# Patient Record
Sex: Male | Born: 1965 | Race: White | Hispanic: No | Marital: Single | Smoking: Never smoker
Health system: Southern US, Community
[De-identification: ages and names within clinical notes are randomized; demographics above are authoritative.]

## PROBLEM LIST (undated history)

## (undated) DIAGNOSIS — R7303 Prediabetes: Secondary | ICD-10-CM

## (undated) DIAGNOSIS — R42 Dizziness and giddiness: Secondary | ICD-10-CM

## (undated) DIAGNOSIS — R030 Elevated blood-pressure reading, without diagnosis of hypertension: Secondary | ICD-10-CM

## (undated) DIAGNOSIS — N529 Male erectile dysfunction, unspecified: Secondary | ICD-10-CM

## (undated) DIAGNOSIS — D333 Benign neoplasm of cranial nerves: Secondary | ICD-10-CM

## (undated) DIAGNOSIS — E119 Type 2 diabetes mellitus without complications: Secondary | ICD-10-CM

## (undated) HISTORY — PX: HERNIA REPAIR: SHX51

## (undated) HISTORY — PX: CLOSED MANIPULATION SHOULDER: SUR205

## (undated) HISTORY — DX: Dizziness and giddiness: R42

## (undated) HISTORY — DX: Male erectile dysfunction, unspecified: N52.9

## (undated) HISTORY — PX: COLONOSCOPY: SHX174

---

## 1988-10-18 HISTORY — PX: HERNIA REPAIR: SHX51

## 2006-02-24 ENCOUNTER — Ambulatory Visit: Payer: Self-pay | Admitting: Gastroenterology

## 2016-06-29 ENCOUNTER — Encounter (HOSPITAL_COMMUNITY): Payer: Self-pay | Admitting: *Deleted

## 2017-05-27 ENCOUNTER — Emergency Department: Payer: BLUE CROSS/BLUE SHIELD

## 2017-05-27 ENCOUNTER — Encounter: Payer: Self-pay | Admitting: Emergency Medicine

## 2017-05-27 ENCOUNTER — Emergency Department
Admission: EM | Admit: 2017-05-27 | Discharge: 2017-05-27 | Disposition: A | Discharge status: Home / Self Care | Payer: BLUE CROSS/BLUE SHIELD | Attending: Emergency Medicine | Admitting: Emergency Medicine

## 2017-05-27 DIAGNOSIS — R42 Dizziness and giddiness: Secondary | ICD-10-CM | POA: Diagnosis present

## 2017-05-27 DIAGNOSIS — H8149 Vertigo of central origin, unspecified ear: Secondary | ICD-10-CM | POA: Diagnosis not present

## 2017-05-27 DIAGNOSIS — E119 Type 2 diabetes mellitus without complications: Secondary | ICD-10-CM | POA: Diagnosis not present

## 2017-05-27 LAB — CBC
HCT: 47 % (ref 40.0–52.0)
HEMOGLOBIN: 16 g/dL (ref 13.0–18.0)
MCH: 30.5 pg (ref 26.0–34.0)
MCHC: 34.1 g/dL (ref 32.0–36.0)
MCV: 89.5 fL (ref 80.0–100.0)
Platelets: 214 10*3/uL (ref 150–440)
RBC: 5.25 MIL/uL (ref 4.40–5.90)
RDW: 13.6 % (ref 11.5–14.5)
WBC: 8.7 10*3/uL (ref 3.8–10.6)

## 2017-05-27 LAB — BASIC METABOLIC PANEL
ANION GAP: 9 (ref 5–15)
BUN: 15 mg/dL (ref 6–20)
CHLORIDE: 105 mmol/L (ref 101–111)
CO2: 26 mmol/L (ref 22–32)
Calcium: 8.7 mg/dL — ABNORMAL LOW (ref 8.9–10.3)
Creatinine, Ser: 0.65 mg/dL (ref 0.61–1.24)
GFR calc Af Amer: >60 mL/min (ref >60)
GFR calc non Af Amer: >60 mL/min (ref >60)
Glucose, Bld: 320 mg/dL — ABNORMAL HIGH (ref 65–99)
POTASSIUM: 3.5 mmol/L (ref 3.5–5.1)
SODIUM: 140 mmol/L (ref 135–145)

## 2017-05-27 LAB — TROPONIN I: Troponin I: <0.03 ng/mL (ref <0.03)

## 2017-05-27 LAB — GLUCOSE, CAPILLARY: Glucose-Capillary: 325 mg/dL — ABNORMAL HIGH (ref 65–99)

## 2017-05-27 MED ORDER — ONDANSETRON HCL 4 MG/2ML IJ SOLN
4.0000 mg | Freq: Once | INTRAMUSCULAR | Status: AC
  Administered 2017-05-27: 4 mg via INTRAVENOUS

## 2017-05-27 MED ORDER — MECLIZINE HCL 25 MG PO TABS
25.0000 mg | ORAL_TABLET | Freq: Once | ORAL | Status: AC
  Administered 2017-05-27: 25 mg via ORAL
  Filled 2017-05-27: qty 1

## 2017-05-27 MED ORDER — MECLIZINE HCL 32 MG PO TABS: 32.0000 mg | ORAL_TABLET | Freq: Three times a day (TID) | ORAL | 0 refills | Status: DC | PRN

## 2017-05-27 MED ORDER — METFORMIN HCL 500 MG PO TABS: 500.0000 mg | ORAL_TABLET | Freq: Two times a day (BID) | ORAL | 0 refills | Status: DC

## 2017-05-27 MED ORDER — ONDANSETRON HCL 4 MG/2ML IJ SOLN
INTRAMUSCULAR | Status: AC
Start: 2017-05-27 — End: 2017-05-27
  Administered 2017-05-27: 4 mg via INTRAVENOUS
  Filled 2017-05-27: qty 2

## 2017-05-27 MED ORDER — SODIUM CHLORIDE 0.9 % IV BOLUS (SEPSIS)
2000.0000 mL | Freq: Once | INTRAVENOUS | Status: AC
  Administered 2017-05-27: 2000 mL via INTRAVENOUS

## 2017-05-27 MED ORDER — MECLIZINE HCL 25 MG PO TABS
50.0000 mg | ORAL_TABLET | Freq: Once | ORAL | Status: AC
  Administered 2017-05-27: 50 mg via ORAL
  Filled 2017-05-27: qty 2

## 2017-05-27 NOTE — ED Notes (Signed)
Patient returned from MRI.

## 2017-05-27 NOTE — ED Triage Notes (Signed)
Patient to ER from home via [facility] for c/o dizziness that began at 1800 last night (05/26/17). Patient states prior to that he felt fine, dizziness hit him suddenly. Patient denies headache at any point. Denies any unilateral weakness. No facial droop noted. No slurred speech. +Diaphoresis and vomiting upon arrival. Received 4mg  Zofran via [facility].

## 2017-05-27 NOTE — ED Notes (Signed)
Patient transported to MRI 

## 2017-05-27 NOTE — ED Notes (Signed)
Patient states dizziness not improved after Meclizine.

## 2017-05-27 NOTE — ED Provider Notes (Signed)
[facility] Emergency Department Provider Note   First MD Initiated Contact with Patient 05/27/17 [phone]     (approximate)  I have reviewed the triage vital signs and the nursing notes.   HISTORY  Chief Complaint Dizziness    HPI Jonathan Herring. is a 51 y.o. male with no past medical history presents to the emergency department with acute onset of dizziness which began at 6 PM yesterday afternoon. Patient states that dizziness is persistent. Worse with any positional change. Patient admits to nausea and vomiting and is actively vomiting on arrival with EMS to the emergency department.   Past medical history None There are no active problems to display for this patient.   Past Surgical History:  Procedure Laterality Date  . HERNIA REPAIR      Prior to Admission medications   Not on File    Allergies No known drug allergies No family history on file.  Social History Social History  Substance Use Topics  . Smoking status: Never Smoker  . Smokeless tobacco: Never Used  . Alcohol use No    Review of Systems Constitutional: No fever/chills Eyes: No visual changes. ENT: No sore throat. Cardiovascular: Denies chest pain. Respiratory: Denies shortness of breath. Gastrointestinal: No abdominal pain.  No nausea, no vomiting.  No diarrhea.  No constipation. Genitourinary: Negative for dysuria. Musculoskeletal: Negative for neck pain.  Negative for back pain. Integumentary: Negative for rash. Neurological: Negative for headaches, focal weakness or numbness.Positive for dizziness   ____________________________________________   PHYSICAL EXAM:  VITAL SIGNS: ED Triage Vitals  Enc Vitals Group     BP 05/27/17 0423 (!) 143/86     Pulse Rate 05/27/17 0423 73     Resp 05/27/17 0423 20     Temp 05/27/17 0423 98.2 F (36.8 C)     Temp Source 05/27/17 0423 Oral     SpO2 05/27/17 0423 98 %     Weight 05/27/17 0424 113 kg (249 lb 1.9 oz)   Height 05/27/17 0424 1.778 m (5\' 10" )     Head Circumference --      Peak Flow --      Pain Score --      Pain Loc --      Pain Edu? --      Excl. in [place]? --     Constitutional: Alert and oriented. Well appearing and in no acute distress. Eyes: Conjunctivae are normal. PERRL. EOMI. positive for horizontal nystagmus Head: Atraumatic. Ears:  Healthy appearing ear canals and TMs bilaterally Nose: No congestion/rhinnorhea. Mouth/Throat: Mucous membranes are moist.  Oropharynx non-erythematous. Neck: No stridor.   Cardiovascular: Normal rate, regular rhythm. Good peripheral circulation. Grossly normal heart sounds. Respiratory: Normal respiratory effort.  No retractions. Lungs CTAB. Gastrointestinal: Soft and nontender. No distention.  Musculoskeletal: No lower extremity tenderness nor edema. No gross deformities of extremities. Neurologic:  Normal speech and language. No gross focal neurologic deficits are appreciated.  Skin:  Skin is warm, dry and intact. No rash noted. Psychiatric: Mood and affect are normal. Speech and behavior are normal.  ____________________________________________   LABS (all labs ordered are listed, but only abnormal results are displayed)  Labs Reviewed  BASIC METABOLIC PANEL - Abnormal; Notable for the following:       Result Value   Glucose, Bld 320 (*)    Calcium 8.7 (*)    All other components within normal limits  GLUCOSE, CAPILLARY - Abnormal; Notable for the following:    Glucose-Capillary 325 (*)  All other components within normal limits  CBC  TROPONIN I  URINALYSIS, COMPLETE (UACMP) WITH MICROSCOPIC  CBG MONITORING, ED   ____________________________________________  EKG  ED ECG REPORT I, [place] N BROWN, the attending physician, personally viewed and interpreted this ECG.   Date: 05/27/2017  EKG Time: 4:24 AM  Rate: 70  Rhythm: Normal sinus rhythm  Axis: Normal  Intervals: Normal  ST&T Change:  None  ____________________________________________  RADIOLOGY I, [place] N BROWN, personally viewed and evaluated these images (plain radiographs) as part of my medical decision making, as well as reviewing the written report by the radiologist.  Ct Head Wo Contrast  Result Date: 05/27/2017 CLINICAL DATA:  Dizziness beginning at 1800 hours last night. Diaphoresis and vomiting. EXAM: CT HEAD WITHOUT CONTRAST TECHNIQUE: Contiguous axial images were obtained from the base of the skull through the vertex without intravenous contrast. COMPARISON:  None. FINDINGS: BRAIN: No intraparenchymal hemorrhage, mass effect nor midline shift. The ventricles and sulci are normal. No acute large vascular territory infarcts. Hypodense inferior pons corresponding to beam hardening artifact best seen on sagittal 27/50. No abnormal extra-axial fluid collections. Basal cisterns are patent. VASCULAR: Trace calcific atherosclerosis carotid siphon included RIGHT vertebral artery. SKULL/SOFT TISSUES: No skull fracture. No significant soft tissue swelling. ORBITS/SINUSES: The included ocular globes and orbital contents are normal.The mastoid aircells and included paranasal sinuses are well-aerated. OTHER: None. IMPRESSION: 1. Beam hardening artifacts through pons. Given patient's symptoms, underlying infarct is possible. 2. Otherwise negative noncontrast head. Electronically Signed   By: Elon Alas M.D.   On: 05/27/2017 05:33      Procedures   ____________________________________________   INITIAL IMPRESSION / ASSESSMENT AND PLAN / ED COURSE  Pertinent labs & imaging results that were available during my care of the patient were reviewed by me and considered in my medical decision making (see chart for details).  51 year old male presenting to the urgency Department via EMS with vertiginous symptoms. Concern for possible central versus peripheral vertigo as such CT scan of the head was performed which revealed  findings in the pons concerning for possible infarct. As such MRI of the brain was performed which revealed no acute infarct.. In addition patient noted to be hyperglycemic on arrival with a glucose of 320. Patient received 2 L IV normal saline will recheck glucose. Patient advised of the diagnosis of diabetes given glucose exceeding 200. I spoke with the patient at length regarding all clinical findings. I was informed that the patient's mother wanted to be informed as well and a such I return to the room and the patient gave permission for me to inform his mother which I did      ____________________________________________  FINAL CLINICAL IMPRESSION(S) / ED DIAGNOSES  Final diagnoses:  New onset type 2 diabetes mellitus ([place])  Vertigo     MEDICATIONS GIVEN DURING THIS VISIT:  Medications  sodium chloride 0.9 % bolus 2,000 mL (2,000 mLs Intravenous New Bag/Given 05/27/17 0430)  ondansetron (ZOFRAN) injection 4 mg (4 mg Intravenous Given 05/27/17 0430)  meclizine (ANTIVERT) tablet 50 mg (50 mg Oral Given 05/27/17 0559)     NEW OUTPATIENT MEDICATIONS STARTED DURING THIS VISIT:  New Prescriptions   No medications on file    Modified Medications   No medications on file    Discontinued Medications   No medications on file     Note:  This document was prepared using Dragon voice recognition software and may include unintentional dictation errors.    Gregor Hams, MD 05/28/17  0740  

## 2017-05-31 ENCOUNTER — Other Ambulatory Visit: Payer: Self-pay | Admitting: Otolaryngology

## 2017-05-31 DIAGNOSIS — R42 Dizziness and giddiness: Secondary | ICD-10-CM

## 2017-05-31 DIAGNOSIS — R972 Elevated prostate specific antigen [PSA]: Secondary | ICD-10-CM | POA: Insufficient documentation

## 2017-05-31 DIAGNOSIS — E118 Type 2 diabetes mellitus with unspecified complications: Secondary | ICD-10-CM | POA: Insufficient documentation

## 2017-06-08 ENCOUNTER — Ambulatory Visit
Admission: RE | Admit: 2017-06-08 | Discharge: 2017-06-08 | Disposition: A | Discharge status: Home / Self Care | Payer: BLUE CROSS/BLUE SHIELD | Source: Ambulatory Visit | Attending: Otolaryngology | Admitting: Otolaryngology

## 2017-06-08 DIAGNOSIS — D333 Benign neoplasm of cranial nerves: Secondary | ICD-10-CM | POA: Diagnosis not present

## 2017-06-08 DIAGNOSIS — R42 Dizziness and giddiness: Secondary | ICD-10-CM

## 2017-06-08 MED ORDER — GADOBENATE DIMEGLUMINE 529 MG/ML IV SOLN
20.0000 mL | Freq: Once | INTRAVENOUS | Status: AC | PRN
  Administered 2017-06-08: 20 mL via INTRAVENOUS

## 2017-06-27 ENCOUNTER — Encounter: Payer: Self-pay | Admitting: Dietician

## 2017-06-27 ENCOUNTER — Ambulatory Visit: Payer: BLUE CROSS/BLUE SHIELD

## 2017-06-27 ENCOUNTER — Encounter: Payer: BLUE CROSS/BLUE SHIELD | Attending: Internal Medicine | Admitting: Dietician

## 2017-06-27 VITALS — BP 114/80 | Ht 71.0 in | Wt 238.2 lb

## 2017-06-27 DIAGNOSIS — Z713 Dietary counseling and surveillance: Secondary | ICD-10-CM | POA: Diagnosis present

## 2017-06-27 DIAGNOSIS — E119 Type 2 diabetes mellitus without complications: Secondary | ICD-10-CM

## 2017-06-27 DIAGNOSIS — E118 Type 2 diabetes mellitus with unspecified complications: Secondary | ICD-10-CM | POA: Insufficient documentation

## 2017-06-27 NOTE — Progress Notes (Signed)
Diabetes Self-Management Education  Visit Type: First/Initial  Appt. Start Time: 1400 Appt. End Time: 4174  06/27/2017  [name], identified by name and date of birth, is a 51 y.o. male with a diagnosis of Diabetes: Type 2.   ASSESSMENT  Blood pressure 114/80, height 5\' 11"  (1.803 m), weight 238 lb 3.2 oz (108 kg). Body mass index is 33.22 kg/m.      Diabetes Self-Management Education - 06/27/17 1534      Visit Information   Visit Type First/Initial     Initial Visit   Diabetes Type Type 2     Health Coping   How would you rate your overall health? Excellent     Psychosocial Assessment   Patient Belief/Attitude about Diabetes Motivated to manage diabetes   Self-care barriers None   Self-management support Family;Doctor's office   Other persons present Patient   Patient Concerns Glycemic Control;Weight Control;Medication;Healthy Lifestyle  become more fit; prevent complications   Special Needs None   Preferred Learning Style Auditory;Visual;Hands on   Learning Readiness Ready  pt already making diet improvements and started regular exercise   What is the last grade level you completed in school? 2 year degree at [employer]     Pre-Education Assessment   Patient understands the diabetes disease and treatment process. Needs Instruction   Patient understands incorporating nutritional management into lifestyle. Needs Instruction   Patient undertands incorporating physical activity into lifestyle. Needs Instruction   Patient understands using medications safely. Needs Instruction   Patient understands monitoring blood glucose, interpreting and using results Needs Review   Patient understands prevention, detection, and treatment of acute complications. Needs Instruction   Patient understands prevention, detection, and treatment of chronic complications. Needs Instruction   Patient understands how to develop strategies to address psychosocial issues. Needs Instruction   Patient understands how to develop strategies to promote health/change behavior. Needs Instruction     Complications   Last HgB A1C per patient/outside source 12 %  05-31-17   How often do you check your blood sugar? 1-2 times/day   Fasting Blood glucose range (mg/dL) 70-129   Have you had a dilated eye exam in the past 12 months? Yes  2 wk ago   Have you had a dental exam in the past 12 months? Yes  2 wk ago   Are you checking your feet? No     Dietary Intake   Breakfast --  eats breakfast at 7a (usually cheerios and milk + 1 stick cheese)   Snack (morning) none   Lunch --  eats lunch at 12p; has decreased intake of fried foods, sweets/desserts and sweetened beverages   Snack (afternoon) --  eats snack at 3p-4p-fruit, popcorn, crackers or nuts   Dinner --  eats supper at 7p-8p; c/o feeling hungry most of time   Snack (evening) --  none   Beverage(s) --  drinks water 8+x/day and milk 2x/day     Exercise   Exercise Type Light (walking / raking leaves)  walks 15 min 4x/day   How many days per week to you exercise? 7   How many minutes per day do you exercise? 60   Total minutes per week of exercise 420     Patient Education   Previous Diabetes Education No   Disease state  Definition of diabetes, type 1 and 2, and the diagnosis of diabetes;Explored patient's options for treatment of their diabetes;Factors that contribute to the development of diabetes   Nutrition management  Role of  diet in the treatment of diabetes and the relationship between the three main macronutrients and blood glucose level;Food label reading, portion sizes and measuring food.;Carbohydrate counting   Physical activity and exercise  Role of exercise on diabetes management, blood pressure control and cardiac health.;Helped patient identify appropriate exercises in relation to his/her diabetes, diabetes complications and other health issue.   Medications Reviewed patients medication for diabetes, action,  purpose, timing of dose and side effects.   Monitoring Purpose and frequency of SMBG.;Taught/discussed recording of test results and interpretation of SMBG.;Yearly dilated eye exam;Identified appropriate SMBG and/or A1C goals.   Chronic complications Relationship between chronic complications and blood glucose control;Lipid levels, blood glucose control and heart disease;Dental care;Retinopathy and reason for yearly dilated eye exams;Reviewed with patient heart disease, higher risk of, and prevention;Nephropathy, what it is, prevention of, the use of ACE, ARB's and early detection of through urine microalbumia.   Personal strategies to promote health Lifestyle issues that need to be addressed for better diabetes care;Helped patient develop diabetes management plan for (enter comment)     Outcomes   Expected Outcomes Demonstrated interest in learning. Expect positive outcomes      Individualized Plan for Diabetes Self-Management Training:   Learning Objective:  Patient will have a greater understanding of diabetes self-management. Patient education plan is to attend individual and/or group sessions per assessed needs and concerns.   Plan:   Patient Instructions   Check blood sugars 2 x day before breakfast and 2 hrs after supper every day and record Bring blood sugar records to the next appointment/class Exercise:  Continue walking 30-45 min/day if able  Eat 3 meals day,   2  small snacks a day in afternoon and at bedtime Space meals 4-6 hours apart Avoid sugar sweetened drinks (soda, tea, coffee, sports drinks, juices) Limit intake of sweets and fried foods Eat 3-4 carbohydrate servings/meal + protein Eat 1 carbohydrate serving/snack + protein Get a Sharps container Return for appointment/classes on:  07-21-17   Expected Outcomes:  Demonstrated interest in learning. Expect positive outcomes  Education material provided: General meal planning guidelines  If problems or questions,  patient to contact team via: [phone]  Future DSME appointment:  07-21-17

## 2017-06-27 NOTE — Patient Instructions (Addendum)
  Check blood sugars 2 x day before breakfast and 2 hrs after supper every day and record Bring blood sugar records to the next appointment/class Exercise:  Continue walking 30-45 min/day if able  Eat 3 meals day,   2  small snacks a day in afternoon and at bedtime Space meals 4-6 hours apart Avoid sugar sweetened drinks (soda, tea, coffee, sports drinks, juices) Limit intake of sweets and fried foods Eat 3-4 carbohydrate servings/meal + protein Eat 1 carbohydrate serving/snack + protein Get a Sharps container Return for appointment/classes on:  07-21-17

## 2017-07-11 DIAGNOSIS — D333 Benign neoplasm of cranial nerves: Secondary | ICD-10-CM | POA: Insufficient documentation

## 2017-07-21 ENCOUNTER — Encounter: Payer: BLUE CROSS/BLUE SHIELD | Attending: Internal Medicine | Admitting: Dietician

## 2017-07-21 VITALS — Ht 71.0 in | Wt 235.1 lb

## 2017-07-21 DIAGNOSIS — E119 Type 2 diabetes mellitus without complications: Secondary | ICD-10-CM

## 2017-07-21 DIAGNOSIS — Z713 Dietary counseling and surveillance: Secondary | ICD-10-CM | POA: Diagnosis present

## 2017-07-21 NOTE — Progress Notes (Signed)

## 2017-07-28 ENCOUNTER — Encounter: Payer: BLUE CROSS/BLUE SHIELD | Admitting: *Deleted

## 2017-07-28 ENCOUNTER — Encounter: Payer: Self-pay | Admitting: *Deleted

## 2017-07-28 VITALS — Wt 234.1 lb

## 2017-07-28 DIAGNOSIS — Z713 Dietary counseling and surveillance: Secondary | ICD-10-CM | POA: Diagnosis not present

## 2017-07-28 DIAGNOSIS — E119 Type 2 diabetes mellitus without complications: Secondary | ICD-10-CM

## 2017-07-28 NOTE — Progress Notes (Signed)

## 2017-08-04 ENCOUNTER — Encounter: Payer: BLUE CROSS/BLUE SHIELD | Admitting: Dietician

## 2017-08-04 VITALS — BP 118/80 | Ht 71.0 in | Wt 236.3 lb

## 2017-08-04 DIAGNOSIS — E669 Obesity, unspecified: Secondary | ICD-10-CM | POA: Insufficient documentation

## 2017-08-04 DIAGNOSIS — E119 Type 2 diabetes mellitus without complications: Secondary | ICD-10-CM

## 2017-08-04 DIAGNOSIS — Z713 Dietary counseling and surveillance: Secondary | ICD-10-CM | POA: Diagnosis not present

## 2017-08-04 NOTE — Progress Notes (Signed)

## 2017-08-08 ENCOUNTER — Encounter: Payer: Self-pay | Admitting: Dietician

## 2018-03-05 DIAGNOSIS — R030 Elevated blood-pressure reading, without diagnosis of hypertension: Secondary | ICD-10-CM | POA: Insufficient documentation

## 2018-07-04 DIAGNOSIS — H9041 Sensorineural hearing loss, unilateral, right ear, with unrestricted hearing on the contralateral side: Secondary | ICD-10-CM | POA: Insufficient documentation

## 2018-07-04 DIAGNOSIS — IMO0001 Reserved for inherently not codable concepts without codable children: Secondary | ICD-10-CM | POA: Insufficient documentation

## 2018-07-08 IMAGING — MR MR MRA HEAD W/O CM
11 series · 42 of 48 positions shown · non-contrast
Comparison: Head CT from yesterday

CLINICAL DATA: Dizziness

EXAM:
MRI HEAD WITHOUT CONTRAST
MRA HEAD WITHOUT CONTRAST
TECHNIQUE: Multiplanar, multiecho pulse sequences of the brain and surrounding
structures were obtained without intravenous contrast. Angiographic
images of the head were obtained using MRA technique without
contrast.

[Series 2: TOF · axial · non-contrast · 0.7mm · 0.43mm/px · z∈[-46,+49]mm · 8 of 140 slices shown]
[im 1/140]
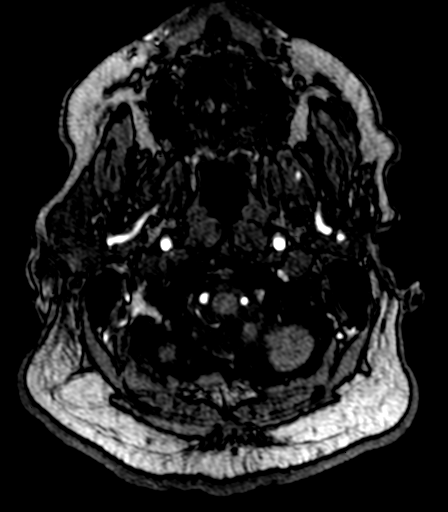
[im 22/140]
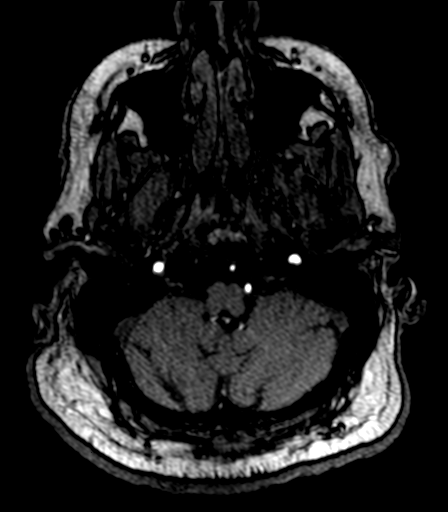
[im 43/140]
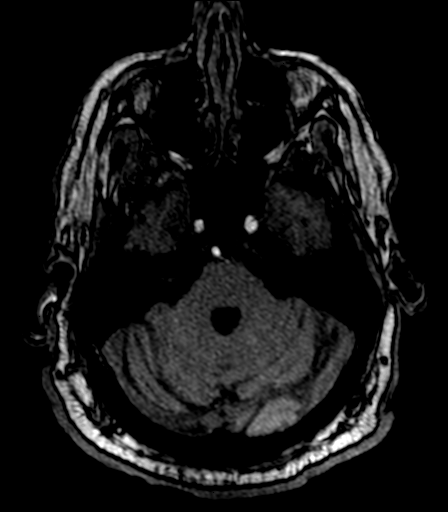
[im 65/140]
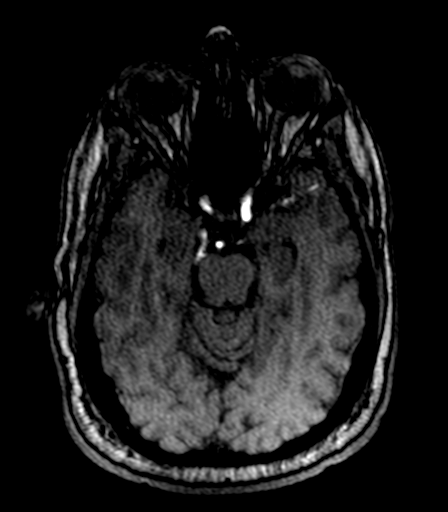
[im 75/140]
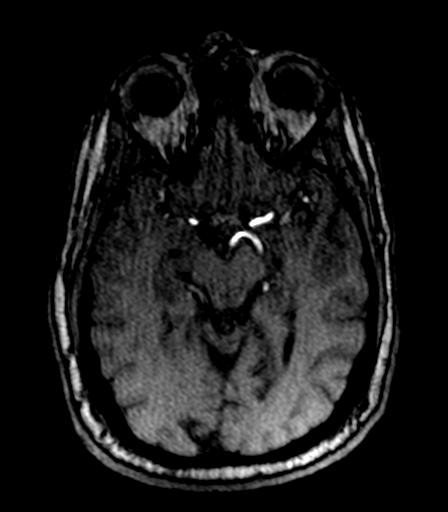
[im 97/140]
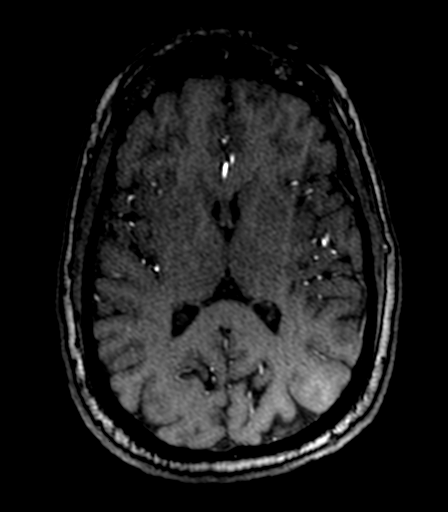
[im 118/140]
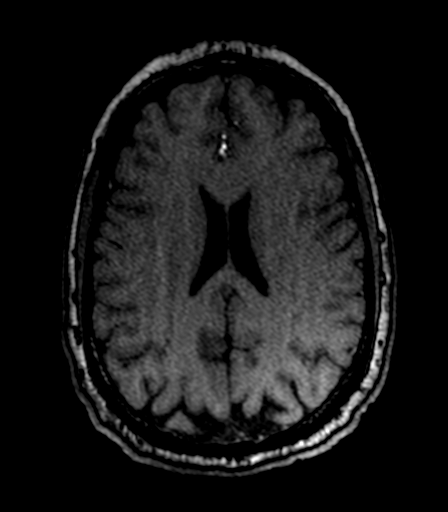
[im 140/140]
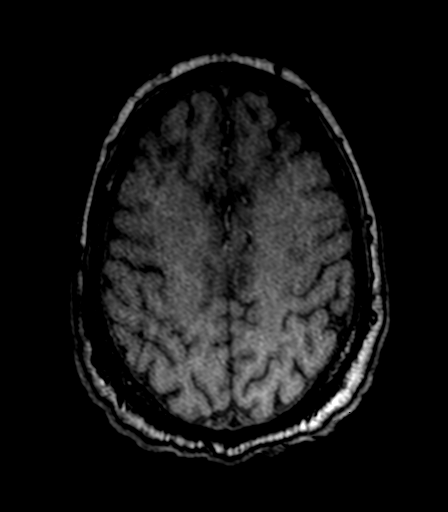

[Series 7: T1 · sagittal · 5.0mm · 0.45mm/px · 2 of 23 slices shown (1 of 2)]
[im 1/23]
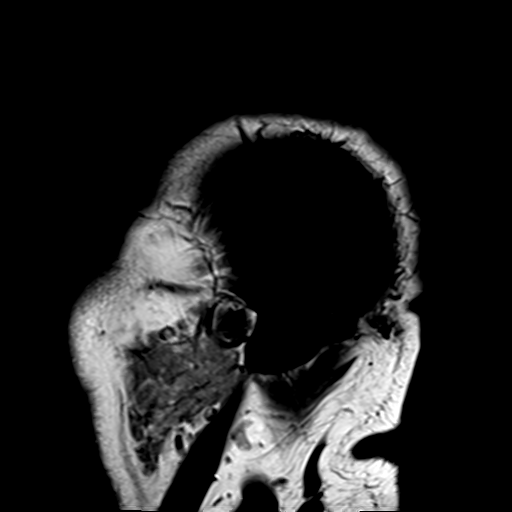
[im 23/23]
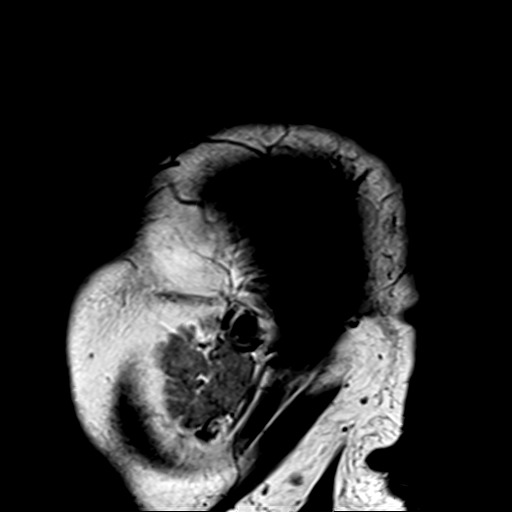

[Series 10: DWI · axial · 3.0mm · 1.80mm/px · z∈[-60,+82]mm · 4 of 49 slices shown (1 of 2)]
[im 1/49]
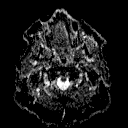
[im 17/49]
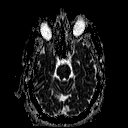
[im 33/49]
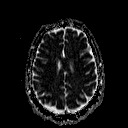
[im 49/49]
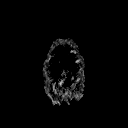

[Series 12: DWI · coronal · 3.0mm · 1.80mm/px · 4 of 44 slices shown (2 of 2)]
[im 1/44]
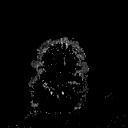
[im 15/44]
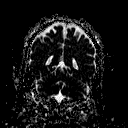
[im 29/44]
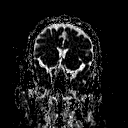
[im 44/44]
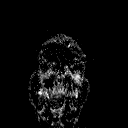

[Series 13: T2 · axial · 5.0mm · 0.60mm/px · z∈[-52,+87]mm · 2 of 23 slices shown (1 of 3)]
[im 1/23]
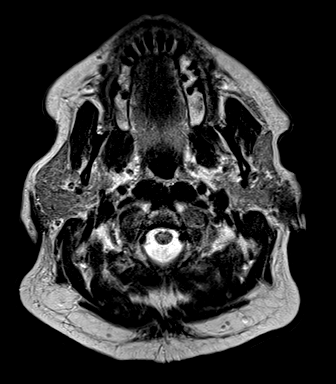
[im 23/23]
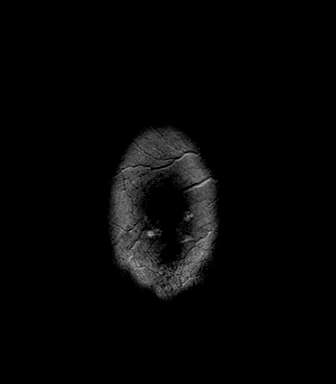

[Series 14: FLAIR · axial · 3.0mm · 0.45mm/px · z∈[-54,+89]mm · 5 of 50 slices shown]
[im 1/50]
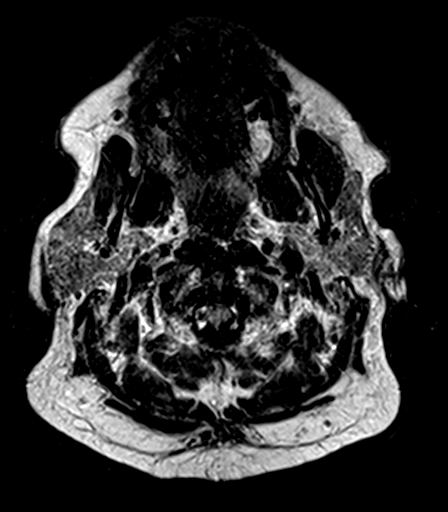
[im 13/50]
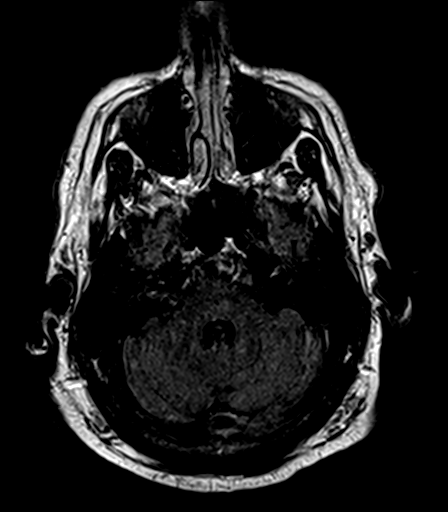
[im 25/50]
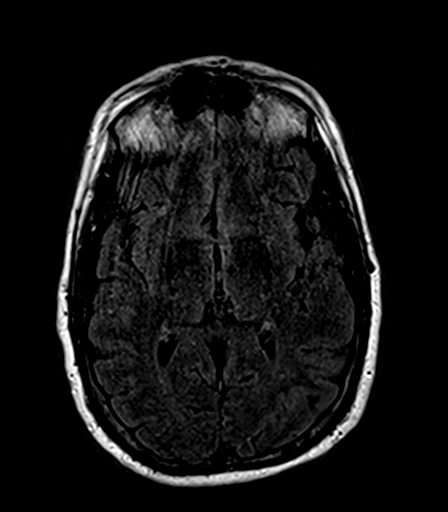
[im 37/50]
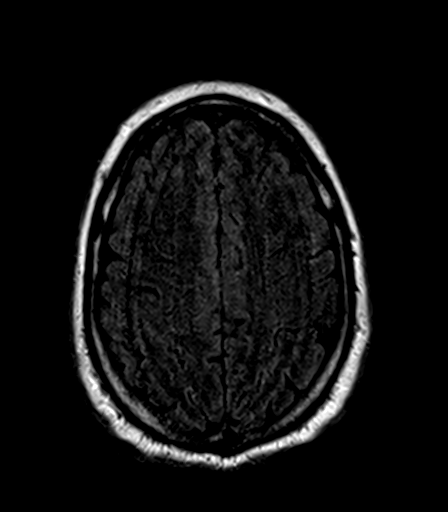
[im 50/50]
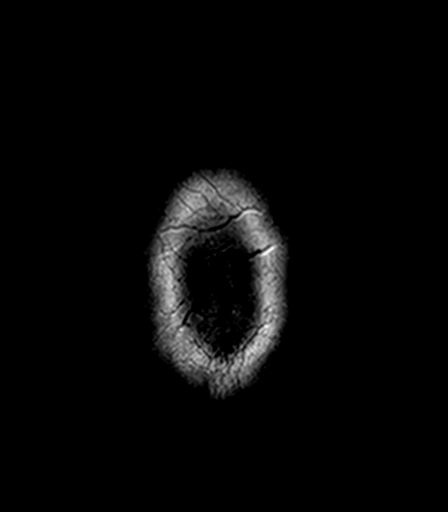

[Series 15: T2 · axial · 5.0mm · 0.45mm/px · z∈[-56,+90]mm · 2 of 24 slices shown (2 of 3)]
[im 1/24]
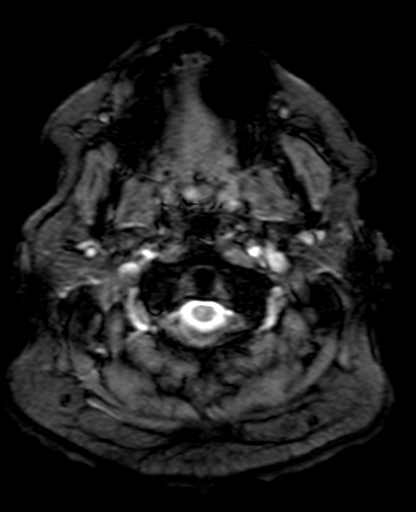
[im 24/24]
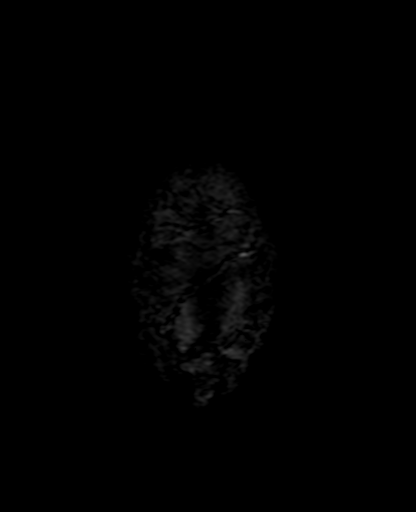

[Series 16: T1 · axial · 3.0mm · 1.00mm/px · z∈[-55,+95]mm · 5 of 52 slices shown (2 of 2)]
[im 1/52]
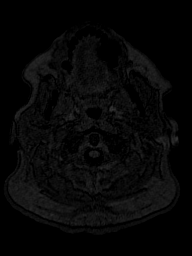
[im 13/52]
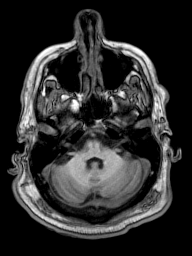
[im 26/52]
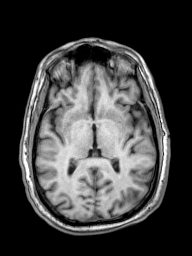
[im 39/52]
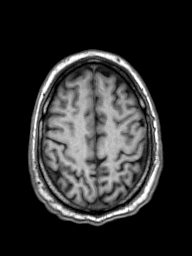
[im 52/52]
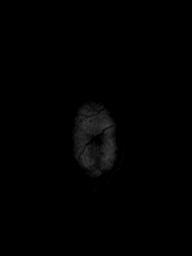

[Series 17: T2 · coronal · 5.0mm · 0.86mm/px · 2 of 27 slices shown (3 of 3)]
[im 1/27]
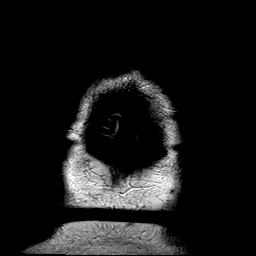
[im 27/27]
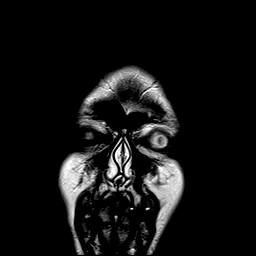

[Series 106: ax (id) · axial · 3.0mm · 1.80mm/px · z∈[-57,+82]mm · 4 of 47 slices shown]
[im 1/47]
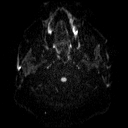
[im 16/47]
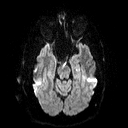
[im 31/47]
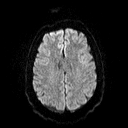
[im 47/47]
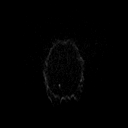

[Series 107: cor (id) · coronal · 3.0mm · 1.80mm/px · 4 of 45 slices shown]
[im 1/45]
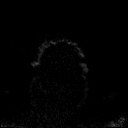
[im 15/45]
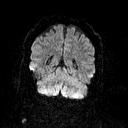
[im 30/45]
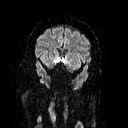
[im 45/45]
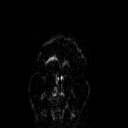

[42 of 48 positions shown; findings below may reference images not displayed]

FINDINGS: MRI HEAD FINDINGS

Brain: No acute infarction, hemorrhage, hydrocephalus, extra-axial
collection or mass effect. No atrophy or white matter disease. On
axial T2 weighted imaging there is an asymmetric nodular appearing
isointense appearance in the superficial right internal auditory
canal. This appearance is usually from volume averaging but there is
also asymmetric signal in this area on axial T1 weighted imaging.

Vascular: Arterial findings below. Normal dural venous sinus flow
voids.

Skull and upper cervical spine: Negative for marrow lesion

Sinuses/Orbits: Negative

MRA HEAD FINDINGS

Aplastic right A1 segment and hypoplastic right P1 segment. There is
a early branch from the left MCA. When accounting for artifact at
the level of the skullbase, sphenoid sinuses, and left M2 segments
there is no stenosis, beading, or aneurysm. No major branch
occlusion.
IMPRESSION: Brain MRI:

1. No acute finding.
2. Asymmetric signal in the right internal auditory canal that is
likely from volume averaging. If persistent or unexplained dizziness
could to outpatient workup for schwannoma.

Intracranial MRA:

Negative.

## 2018-10-26 DIAGNOSIS — N529 Male erectile dysfunction, unspecified: Secondary | ICD-10-CM | POA: Insufficient documentation

## 2019-01-29 ENCOUNTER — Encounter: Payer: Self-pay | Admitting: Emergency Medicine

## 2019-01-29 ENCOUNTER — Emergency Department
Admission: EM | Admit: 2019-01-29 | Discharge: 2019-01-29 | Disposition: A | Discharge status: Home / Self Care | Payer: BLUE CROSS/BLUE SHIELD | Attending: Emergency Medicine | Admitting: Emergency Medicine

## 2019-01-29 ENCOUNTER — Other Ambulatory Visit: Payer: Self-pay

## 2019-01-29 DIAGNOSIS — Z7984 Long term (current) use of oral hypoglycemic drugs: Secondary | ICD-10-CM | POA: Diagnosis not present

## 2019-01-29 DIAGNOSIS — R42 Dizziness and giddiness: Secondary | ICD-10-CM | POA: Diagnosis not present

## 2019-01-29 DIAGNOSIS — D333 Benign neoplasm of cranial nerves: Secondary | ICD-10-CM | POA: Diagnosis not present

## 2019-01-29 DIAGNOSIS — E119 Type 2 diabetes mellitus without complications: Secondary | ICD-10-CM | POA: Insufficient documentation

## 2019-01-29 HISTORY — DX: Type 2 diabetes mellitus without complications: E11.9

## 2019-01-29 LAB — BASIC METABOLIC PANEL
Anion gap: 10 (ref 5–15)
BUN: 26 mg/dL — ABNORMAL HIGH (ref 6–20)
CO2: 27 mmol/L (ref 22–32)
Calcium: 9.2 mg/dL (ref 8.9–10.3)
Chloride: 105 mmol/L (ref 98–111)
Creatinine, Ser: 0.53 mg/dL — ABNORMAL LOW (ref 0.61–1.24)
GFR calc Af Amer: >60 mL/min (ref >60)
GFR calc non Af Amer: >60 mL/min (ref >60)
Glucose, Bld: 116 mg/dL — ABNORMAL HIGH (ref 70–99)
Potassium: 3.6 mmol/L (ref 3.5–5.1)
Sodium: 142 mmol/L (ref 135–145)

## 2019-01-29 LAB — CBC
HCT: 47 % (ref 39.0–52.0)
Hemoglobin: 16.1 g/dL (ref 13.0–17.0)
MCH: 30.6 pg (ref 26.0–34.0)
MCHC: 34.3 g/dL (ref 30.0–36.0)
MCV: 89.2 fL (ref 80.0–100.0)
Platelets: 240 10*3/uL (ref 150–400)
RBC: 5.27 MIL/uL (ref 4.22–5.81)
RDW: 13 % (ref 11.5–15.5)
WBC: 7.1 10*3/uL (ref 4.0–10.5)
nRBC: 0 % (ref 0.0–0.2)

## 2019-01-29 MED ORDER — SODIUM CHLORIDE 0.9% FLUSH: 3.0000 mL | Freq: Once | INTRAVENOUS | Status: DC

## 2019-01-29 MED ORDER — DIAZEPAM 2 MG PO TABS: 2.0000 mg | ORAL_TABLET | Freq: Three times a day (TID) | ORAL | 0 refills | Status: AC | PRN

## 2019-01-29 MED ORDER — MECLIZINE HCL 25 MG PO TABS: 25.0000 mg | ORAL_TABLET | Freq: Three times a day (TID) | ORAL | 0 refills | Status: DC | PRN

## 2019-01-29 MED ORDER — ONDANSETRON HCL 4 MG PO TABS: 4.0000 mg | ORAL_TABLET | Freq: Three times a day (TID) | ORAL | 0 refills | Status: DC | PRN

## 2019-01-29 MED ORDER — LORAZEPAM 2 MG/ML IJ SOLN
2.0000 mg | Freq: Once | INTRAMUSCULAR | Status: AC
  Administered 2019-01-29: 2 mg via INTRAVENOUS
  Filled 2019-01-29: qty 1

## 2019-01-29 MED ORDER — SODIUM CHLORIDE 0.9 % IV BOLUS
1000.0000 mL | Freq: Once | INTRAVENOUS | Status: AC
  Administered 2019-01-29: 16:00:00 1000 mL via INTRAVENOUS

## 2019-01-29 MED ORDER — ONDANSETRON HCL 4 MG/2ML IJ SOLN
4.0000 mg | Freq: Once | INTRAMUSCULAR | Status: AC
  Administered 2019-01-29: 16:00:00 4 mg via INTRAVENOUS
  Filled 2019-01-29: qty 2

## 2019-01-29 MED ORDER — HYDROMORPHONE HCL 1 MG/ML IJ SOLN: 1.0000 mg | Freq: Once | INTRAMUSCULAR | Status: DC

## 2019-01-29 NOTE — ED Provider Notes (Addendum)
[facility] Emergency Department Provider Note  ____________________________________________   I have reviewed the triage vital signs and the nursing notes. Where available I have reviewed prior notes and, if possible and indicated, outside hospital notes.    HISTORY  Chief Complaint Dizziness    HPI Jonathan Herring. is a 53 y.o. 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  No head trauma no focal numbness or weakness did not have any headache.  No other complaint no change in hearing or speech.  States that he does have a buzzing sound sometimes from his schwannoma.  Also has been vomiting with this vertigo.  His lungs he sits still he is doing okay, when he moves it is worse.  No other alleviating or aggravating factors no other complaints tried meclizine which has had some limited help.   Past Medical History:  Diagnosis Date  . Diabetes mellitus without complication ([place])   . Vertigo     Patient Active Problem List   Diagnosis Date Noted  . Obesity, unspecified 08/04/2017  . Right acoustic neuroma ([place]) 07/11/2017  . Elevated PSA measurement 05/31/2017  . Intermittent vertigo 05/31/2017  . Type 2 diabetes mellitus with complication, without long-term current use of insulin ([place]) 05/31/2017    Past Surgical History:  Procedure Laterality Date  . HERNIA REPAIR      Prior to Admission medications   Medication Sig Start Date End Date Taking? Authorizing Provider  meclizine (ANTIVERT) 25 MG tablet Take 25 mg by mouth 3 (three) times daily as needed.  05/27/17   [provider]  metFORMIN (GLUCOPHAGE) 500 MG tablet Take 1 tablet (500 mg total) by mouth 2 (two) times daily with a meal. 05/27/17   Gregor Hams, MD    Allergies Patient has no known  allergies.  No family history on file.  Social History Social History   Tobacco Use  . Smoking status: Never Smoker  . Smokeless tobacco: Never Used  Substance Use Topics  . Alcohol use: No  . Drug use: No    Review of Systems Constitutional: No fever/chills Eyes: No visual changes. ENT: No sore throat. No stiff neck no neck pain Cardiovascular: Denies chest pain. Respiratory: Denies shortness of breath. Gastrointestinal:   + vomiting.  No diarrhea.  No constipation. Genitourinary: Negative for dysuria. Musculoskeletal: Negative lower extremity swelling Skin: Negative for rash. Neurological: Negative for severe headaches, focal weakness or numbness.   ____________________________________________   PHYSICAL EXAM:  VITAL SIGNS: ED Triage Vitals  Enc Vitals Group     BP 01/29/19 1510 132/71     Pulse Rate 01/29/19 1510 77     Resp 01/29/19 1510 14     Temp 01/29/19 1510 98 F (36.7 C)     Temp Source 01/29/19 1510 Oral     SpO2 01/29/19 1510 100 %     Weight 01/29/19 1511 250 lb (113.4 kg)     Height 01/29/19 1511 5\' 11"  (1.803 m)     Head Circumference --      Peak Flow --      Pain Score 01/29/19 1511 0     Pain Loc --      Pain Edu? --      Excl. in [place]? --     Constitutional: Alert and oriented. Well appearing and  in no acute distress. Eyes: Conjunctivae are normal Head: Atraumatic HEENT: No congestion/rhinnorhea. Mucous membranes are moist.  Oropharynx non-erythematous Neck:   Nontender with no meningismus, no masses, no stridor Cardiovascular: Normal rate, regular rhythm. Grossly normal heart sounds.  Good peripheral circulation. Respiratory: Normal respiratory effort.  No retractions. Lungs CTAB. Abdominal: Soft and nontender. No distention. No guarding no rebound Back:  There is no focal tenderness or step off.  there is no midline tenderness there are no lesions noted. there is no CVA tenderness Musculoskeletal: No lower extremity tenderness, no  upper extremity tenderness. No joint effusions, no DVT signs strong distal pulses no edema Neurologic:  Normal speech and language. No gross focal neurologic deficits are appreciated.  Patient has positive nystagmus which fatigues, otherwise NIH stroke scale is 0.  Cerebellar signs are normal. Skin:  Skin is warm, dry and intact. No rash noted. Psychiatric: Mood and affect are normal. Speech and behavior are normal.  ____________________________________________   LABS (all labs ordered are listed, but only abnormal results are displayed)  Labs Reviewed  BASIC METABOLIC PANEL - Abnormal; Notable for the following components:      Result Value   Glucose, Bld 116 (*)    BUN 26 (*)    Creatinine, Ser 0.53 (*)    All other components within normal limits  CBC  URINALYSIS, COMPLETE (UACMP) WITH MICROSCOPIC    Pertinent labs  results that were available during my care of the patient were reviewed by me and considered in my medical decision making (see chart for details). ____________________________________________  EKG  I personally interpreted any EKGs ordered by me or triage EKG ordered by triage sinus bradycardia rate 58 normal axis no acute ST elevations aside from mild bradycardia normal EKG _________________________________________  RADIOLOGY  Pertinent labs & imaging results that were available during my care of the patient were reviewed by me and considered in my medical decision making (see chart for details). If possible, patient and/or family made aware of any abnormal findings.  No results found. ____________________________________________    PROCEDURES  Procedure(s) performed: None  Procedures  Critical Care performed: None  ____________________________________________   INITIAL IMPRESSION / ASSESSMENT AND PLAN / ED COURSE  Pertinent labs & imaging results that were available during my care of the patient were reviewed by me and considered in my medical  decision making (see chart for details).  Here with recurrent true vertigo TMs are reassuring, no evidence of any CVA or head bleed, patient has had this multiple times before as a complication of his known benign schwannoma which she has declined surgical or other interventions for in the past.  He is in no acute distress.  I have a informed him that I likely cannot get him completely asymptomatic my goal is to get these symptoms to be tolerable and we will try the usual medications for this.  Patient understands the limitations of my ability to counteract this chronic problem in the ER fully.  The definitive care obviously would be probably a more aggressive approach to the schwannoma which she has thus far, according to notes, declined.  Nothing to suggest that he is having a stroke, cerebellar or otherwise, we will reassess however after appropriate intervention.  ----------------------------------------- 4:24 PM on 01/29/2019 -----------------------------------------  And in no acute distress resting comfortably in the bed, little somnolent but easily awakened with verbal stimuli, states he still feels dizzy.  ----------------------------------------- 5:07 PM on 01/29/2019 -----------------------------------------  Remains somewhat somnolent from the Ativan however  he has ongoing vertigo symptoms when he wakes up and moves around.  He is in no acute distress otherwise neurologically intact at this time as well.  And I discussed admission versus discharge to strong preference would be to go home if possible at this time he does not wish further care if he can ambulate he would like to be discharged.  We will see how he does with p.o. and ambulation.  Has not vomited here.  ----------------------------------------- 7:50 PM on 01/29/2019 -----------------------------------------  Able to ambulate but is somewhat unsteady on his feet I have offered and advised admission I did have the  hospitalist see him for admission but however, he declines admission and is would like to go home.  He declines imaging at this time which I do not think is unreasonable given that we know the likely pathology given multiple MRIs and multiple episodes of this in the past.  Nothing to suggest bleed or acute change in his mass or meningitis etc.  He remains awake and alert at this time, given that he declines admission we will discharge him on anti-vertigo medications and nausea medications and have him follow closely with his ENT doctor.  ----------------------------------------- 8:45 PM on 01/29/2019 -----------------------------------------  Did have the hospitalist see him again for possible admission given that he states he is still feeling somewhat dizzy prior to discharge and he again refused admission I think he has the capacity make decisions and understands risk benefits, and alternative of this decision and is certainly therefore able to make it.  I think he may be having some concern about possible covid.  He does not want to stay for imaging he would just like to be discharged.   ____________________________________________   FINAL CLINICAL IMPRESSION(S) / ED DIAGNOSES  Final diagnoses:  None      This chart was dictated using voice recognition software.  Despite best efforts to proofread,  errors can occur which can change meaning.      Schuyler Amor, MD 01/29/19 1608    Schuyler Amor, MD 01/29/19 1624    Schuyler Amor, MD 01/29/19 1708    Schuyler Amor, MD 01/29/19 1723    Schuyler Amor, MD 01/29/19 Leonides Schanz    Schuyler Amor, MD 01/29/19 2046

## 2019-01-29 NOTE — ED Notes (Signed)
Pt ambulated down hallway with this tech, Lydia,EDT and Caitlyn,EDT approximately 114ft. Upon returning to rm pt \\c /o feeling dizzy. Kate,RN made aware.

## 2019-01-29 NOTE — Consult Note (Signed)
[place] at [place] NAME: Jonathan Herring    MR#:  [id number]  DATE OF BIRTH:  November 01, 1965  DATE OF ADMISSION:  01/29/2019  PRIMARY CARE PHYSICIAN: Glendon Axe, MD   CONSULT REQUESTING/REFERRING PHYSICIAN: Dr. Burlene Arnt  REASON FOR CONSULT: Vertigo  CHIEF COMPLAINT:   Chief Complaint  Patient presents with  . Dizziness    HISTORY OF PRESENT ILLNESS:  Jonathan Herring  is a 53 y.o. male with a known history of right benign vestibular schwannoma, diabetes mellitus, vertigo presents to the emergency room complaining of vertigo, vomiting.  Patient has had no vomiting in the emergency room.  He did take meclizine prior to coming to the ER.  He was given Ativan as Valium was not available.  Patient feels a little better.  He is having difficulty ambulating on his own and needed 2 people assistance.  ER physician requested admission due to significant vertigo.  He does have ringing in right ears.  Vertigo and ringing are chronic symptoms.  He has known schwannoma.  He is waiting for surgery.  No focal weakness or numbness. No chest pain or shortness of breath.  No fever or cough or shortness of breath.  PAST MEDICAL HISTORY:   Past Medical History:  Diagnosis Date  . Diabetes mellitus without complication ([place])   . Vertigo     PAST SURGICAL HISTOIRY:   Past Surgical History:  Procedure Laterality Date  . HERNIA REPAIR      SOCIAL HISTORY:   Social History   Tobacco Use  . Smoking status: Never Smoker  . Smokeless tobacco: Never Used  Substance Use Topics  . Alcohol use: No    FAMILY HISTORY:  No family history on file. No family history of schwannoma  DRUG ALLERGIES:  No Known Allergies  REVIEW OF SYSTEMS:   ROS  CONSTITUTIONAL: Fatigue EYES: No blurred or double vision.  EARS, NOSE, AND THROAT: No tinnitus or ear pain.  RESPIRATORY: No cough, shortness of breath, wheezing or hemoptysis.  CARDIOVASCULAR: No chest pain,  orthopnea, edema.  GASTROINTESTINAL: No nausea, vomiting, diarrhea or abdominal pain.  GENITOURINARY: No dysuria, hematuria.  ENDOCRINE: No polyuria, nocturia,  HEMATOLOGY: No anemia, easy bruising or bleeding SKIN: No rash or lesion. MUSCULOSKELETAL: No joint pain or arthritis.   NEUROLOGIC: Dizziness PSYCHIATRY: No anxiety or depression.   MEDICATIONS AT HOME:   Prior to Admission medications   Medication Sig Start Date End Date Taking? Authorizing Provider  fluticasone (FLONASE) 50 MCG/ACT nasal spray Place 2 sprays into the nose daily. 01/01/19 01/01/20 Yes [provider]  meclizine (ANTIVERT) 25 MG tablet Take 25 mg by mouth 3 (three) times daily as needed.  05/27/17  Yes [provider]  metFORMIN (GLUCOPHAGE) 500 MG tablet Take 1 tablet (500 mg total) by mouth 2 (two) times daily with a meal. 05/27/17  Yes Gregor Hams, MD  sildenafil (VIAGRA) 100 MG tablet Take 50 mg by mouth as directed. 10/26/18  Yes [provider]  diazepam (VALIUM) 2 MG tablet Take 1 tablet (2 mg total) by mouth every 8 (eight) hours as needed for up to 3 days (vertigo). 01/29/19 02/01/19  Schuyler Amor, MD  meclizine (ANTIVERT) 25 MG tablet Take 1 tablet (25 mg total) by mouth 3 (three) times daily as needed. 01/29/19   Schuyler Amor, MD  ondansetron (ZOFRAN) 4 MG tablet Take 1 tablet (4 mg total) by mouth every 8 (eight) hours as needed for nausea or vomiting.  01/29/19   Schuyler Amor, MD  terbinafine (LAMISIL) 250 MG tablet Take 250 mg by mouth daily. 01/03/19   [provider]      VITAL SIGNS:  Blood pressure 123/72, pulse 65, temperature 98 F (36.7 C), temperature source Oral, resp. rate 16, height 5\' 11"  (1.803 m), weight 113.4 kg, SpO2 96 %.  PHYSICAL EXAMINATION:  GENERAL:  53 y.o.-year-old patient lying in the bed with no acute distress.  EYES: Pupils equal, round, reactive to light and accommodation. No scleral icterus. Extraocular muscles intact.   HEENT: Head atraumatic, normocephalic. Oropharynx and nasopharynx clear.  NECK:  Supple, no jugular venous distention. No thyroid enlargement, no tenderness.  LUNGS: Normal breath sounds bilaterally, no wheezing, rales,rhonchi or crepitation. No use of accessory muscles of respiration.  CARDIOVASCULAR: S1, S2 normal. No murmurs, rubs, or gallops.  ABDOMEN: Soft, nontender, nondistended. Bowel sounds present. No organomegaly or mass.  EXTREMITIES: No pedal edema, cyanosis, or clubbing.  NEUROLOGIC: Cranial nerves II through XII are intact. Muscle strength 5/5 in all extremities. Sensation intact. Gait not checked.  PSYCHIATRIC: The patient is alert and oriented x 3.  SKIN: No obvious rash, lesion, or ulcer.   LABORATORY PANEL:   CBC Recent Labs  Lab 01/29/19 1516  WBC 7.1  HGB 16.1  HCT 47.0  PLT 240   ------------------------------------------------------------------------------------------------------------------  Chemistries  Recent Labs  Lab 01/29/19 1516  NA 142  K 3.6  CL 105  CO2 27  GLUCOSE 116*  BUN 26*  CREATININE 0.53*  CALCIUM 9.2   ------------------------------------------------------------------------------------------------------------------  Cardiac Enzymes No results for input(s): TROPONINI in the last 168 hours. ------------------------------------------------------------------------------------------------------------------  RADIOLOGY:  No results found.  EKG:   Orders placed or performed during the hospital encounter of 01/29/19  . EKG 12-Lead  . EKG 12-Lead  . ED EKG  . ED EKG    IMPRESSION AND PLAN:   *Vertigo and tinnitus secondary to right vestibular benign schwannoma.  Patient is [facility].  Patient has been given meclizine and Ativan.  At this time patient's dizziness is mildly improved.  He still needing help to walk.  Discussed with patient regarding managing symptoms at home versus overnight  observation admission.  Patient has help at home from his mom.  Patient request that he be discharged home so that he can follow-up with [facility].  He already has prescription for meclizine and has picked it up earlier today.  Discussed with Dr. Burlene Arnt that patient is requesting to be discharged home and not be admitted under observation.  Patient will be discharged home from ER.  *Diabetes mellitus.  Blood glucose 116.  Stable   All the records are reviewed and case discussed with Consulting provider. Management plans discussed with the patient, family and they are in agreement.  CODE STATUS: Full code  TOTAL TIME TAKING CARE OF THIS PATIENT: 45 minutes.    Neita Carp M.D on 01/29/2019 at 8:16 PM  Between 7am to 6pm - Pager - [phone]  After 6pm go to [email or web address] - password [email or web address] [place] [facility]  Office  [phone]  CC: Primary care Physician: Glendon Axe, MD     Note: This dictation was prepared with Dragon dictation along with smaller phrase technology. Any transcriptional errors that result from this process are unintentional.

## 2019-01-29 NOTE — ED Notes (Signed)
Patient able to ambulate without difficulty. Discharged home with mother.

## 2019-01-29 NOTE — Discharge Instructions (Addendum)
We have offered you admission to the hospital you would prefer to go home.  This is certainly your choice but does limit our ability to continue to monitor you and evaluate you.  Please call your ENT doctor first thing in the morning tomorrow as well as your primary care doctor.  Take the medication as prescribed do not drive on the medication; not drink on the medication, if you have new or worrisome symptoms including any numbness or weakness in any part of your body, headache, difficulty speaking, other neurologic or other symptoms of concern, change your mind about being admitted, or you feel unsafe being at home or you have persistent vomiting or other concerns return to the ER.

## 2019-01-29 NOTE — ED Notes (Signed)
Attempted to ambulate patient, patient reports dizziness but no nausea. RN assisted patient to stand at bedside, did not ambulate due to dizziness.

## 2019-01-29 NOTE — ED Triage Notes (Signed)
Says dizziness started last night and ringing in right ear.  Says it is better lying on stomach and worse lying on back and it is spinning dizziness.  He has had this before.  He took a meclizine today.

## 2019-02-21 ENCOUNTER — Other Ambulatory Visit: Payer: Self-pay

## 2019-02-21 ENCOUNTER — Ambulatory Visit: Payer: BLUE CROSS/BLUE SHIELD | Admitting: Nurse Practitioner

## 2019-02-21 ENCOUNTER — Encounter: Payer: Self-pay | Admitting: Nurse Practitioner

## 2019-02-21 VITALS — BP 136/76 | HR 64 | Resp 16 | Ht 71.0 in | Wt 248.8 lb

## 2019-02-21 DIAGNOSIS — R42 Dizziness and giddiness: Secondary | ICD-10-CM

## 2019-02-21 DIAGNOSIS — E119 Type 2 diabetes mellitus without complications: Secondary | ICD-10-CM | POA: Diagnosis not present

## 2019-02-21 DIAGNOSIS — D333 Benign neoplasm of cranial nerves: Secondary | ICD-10-CM | POA: Diagnosis not present

## 2019-02-21 NOTE — Progress Notes (Signed)
[facility] [place], [place] [address]  Internal MEDICINE  Office Visit Note  Patient Name: Jonathan Herring  [id number]  [id number]  Date of Service: 02/21/2019   Complaints/HPI Pt is here for establishment of PCP. Chief Complaint  Patient presents with  . New Patient (Initial Visit)  . Diabetes   The patient is here to establish primary care provider. His current primary care provider is getting ready to move and her office is no longer in his insurance network. He is diabetic. He takes metformin twice daily. His last HgbA1c was checked 12/2018 and was 6.2. 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 He has had two episodes of vertigo in the past two years. Was prescribed meclizine and ondansetron. He has these prescriptions in case this happens again. He is followed by ENT for this. Has MRI every six months for monitoring. He does experience intermittent ringing in the ears. Sometimes this is more noticeable than others.    Current Medication: Outpatient Encounter Medications as of 02/21/2019  Medication Sig  . fluticasone (FLONASE) 50 MCG/ACT nasal spray Place 2 sprays into the nose daily.  . meclizine (ANTIVERT) 25 MG tablet Take 1 tablet (25 mg total) by mouth 3 (three) times daily as needed.  . metFORMIN (GLUCOPHAGE) 500 MG tablet Take 1 tablet (500 mg total) by mouth 2 (two) times daily with a meal.  . ondansetron (ZOFRAN) 4 MG tablet Take 1 tablet (4 mg total) by mouth every 8 (eight) hours as needed for nausea or vomiting.  . sildenafil (VIAGRA) 100 MG tablet Take 50 mg by mouth as directed.  . terbinafine (LAMISIL) 250 MG tablet Take 250 mg by mouth daily.  . [DISCONTINUED] meclizine (ANTIVERT) 25 MG tablet Take 25 mg by mouth 3 (three) times daily as needed.    No facility-administered encounter medications on file as of 02/21/2019.     Surgical History: Past Surgical History:  Procedure Laterality Date  . HERNIA REPAIR      Medical History: Past  Medical History:  Diagnosis Date  . Diabetes mellitus without complication ([place])   . Vertigo     Family History: History reviewed. No pertinent family history.  Social History   Socioeconomic History  . Marital status: Single    Spouse name: Not on file  . Number of children: Not on file  . Years of education: Not on file  . Highest education level: Not on file  Occupational History  . Not on file  Social Needs  . Financial resource strain: Not on file  . Food insecurity:    Worry: Not on file    Inability: Not on file  . Transportation needs:    Medical: Not on file    Non-medical: Not on file  Tobacco Use  . Smoking status: Never Smoker  . Smokeless tobacco: Never Used  Substance and Sexual Activity  . Alcohol use: No  . Drug use: No  . Sexual activity: Not on file  Lifestyle  . Physical activity:    Days per week: Not on file    Minutes per session: Not on file  . Stress: Not on file  Relationships  . Social connections:    Talks on phone: Not on file    Gets together: Not on file    Attends religious service: Not on file    Active member of club or organization: Not on file    Attends meetings of clubs or organizations: Not on file  Relationship status: Not on file  . Intimate partner violence:    Fear of current or ex partner: Not on file    Emotionally abused: Not on file    Physically abused: Not on file    Forced sexual activity: Not on file  Other Topics Concern  . Not on file  Social History Narrative  . Not on file     Review of Systems  Constitutional: Negative for activity change, chills, fatigue and unexpected weight change.  HENT: Negative for congestion, postnasal drip, rhinorrhea, sneezing and sore throat.        Ringing in the left ear, intermittently.   Respiratory: Negative for cough, chest tightness and shortness of breath.   Cardiovascular: Negative for chest pain and palpitations.  Gastrointestinal: Negative for abdominal pain,  constipation, diarrhea, nausea and vomiting.  Endocrine: Negative for cold intolerance, heat intolerance, polydipsia and polyuria.       Blood sugars doing well   Musculoskeletal: Negative for arthralgias, back pain, joint swelling and neck pain.  Skin: Negative for rash.  Neurological: Positive for dizziness. Negative for tremors and numbness.       Two episodes of vertigo in the past tow years for which he did seek emergency care.   Hematological: Negative for adenopathy. Does not bruise/bleed easily.  Psychiatric/Behavioral: Negative for behavioral problems (Depression), sleep disturbance and suicidal ideas. The patient is not nervous/anxious.    Today's Vitals   02/21/19 0849  BP: 136/76  Pulse: 64  Resp: 16  SpO2: 99%  Weight: 248 lb 12.8 oz (112.9 kg)  Height: 5\' 11"  (1.803 m)   Body mass index is 34.7 kg/m.  Physical Exam Vitals signs and nursing note reviewed.  Constitutional:      General: He is not in acute distress.    Appearance: Normal appearance. He is well-developed. He is not diaphoretic.  HENT:     Head: Normocephalic and atraumatic.     Mouth/Throat:     Pharynx: No oropharyngeal exudate.  Eyes:     Pupils: Pupils are equal, round, and reactive to light.  Neck:     Musculoskeletal: Normal range of motion and neck supple.     Thyroid: No thyromegaly.     Vascular: No carotid bruit or JVD.     Trachea: No tracheal deviation.  Cardiovascular:     Rate and Rhythm: Normal rate and regular rhythm.     Heart sounds: Normal heart sounds. No murmur. No friction rub. No gallop.   Pulmonary:     Effort: Pulmonary effort is normal. No respiratory distress.     Breath sounds: Normal breath sounds. No wheezing or rales.  Chest:     Chest wall: No tenderness.  Abdominal:     General: Bowel sounds are normal.     Palpations: Abdomen is soft.  Musculoskeletal: Normal range of motion.  Lymphadenopathy:     Cervical: No cervical adenopathy.  Skin:    General: Skin  is warm and dry.  Neurological:     Mental Status: He is alert and oriented to person, place, and time.     Cranial Nerves: No cranial nerve deficit.  Psychiatric:        Behavior: Behavior normal.        Thought Content: Thought content normal.        Judgment: Judgment normal.   Assessment/Plan: 1. Diabetes mellitus without complication ([place]) Most recent check HgbA1c done 12/2018 and was 6.2. patient should continue metformin as prescribed. Limit carbs and  sugar in the diet. Will check HgbA1c at next visit.   2. Acoustic neuroma ([place]) Continue to have monitored per ENT.   3. Intermittent vertigo Likely caused from acoustic neuroma. May take zofran and meclizine as needed and as prescribed. Will provide refills as indicated.   General Counseling: per beagley understanding of the findings of todays visit and agrees with plan of treatment. I have discussed any further diagnostic evaluation that may be needed or ordered today. We also reviewed his medications today. he has been encouraged to call the office with any questions or concerns that should arise related to todays visit.    Counseling:  Diabetes Counseling:  1. Addition of ACE inh/ ARB'S for nephroprotection. Microalbumin is updated  2. Diabetic foot care, prevention of complications. Podiatry consult 3. Exercise and lose weight.  4. Diabetic eye examination, Diabetic eye exam is updated  5. Monitor blood sugar closlely. nutrition counseling.  6. Sign and symptoms of hypoglycemia including shaking sweating,confusion and headaches.  This patient was seen by Leretha Pol FNP Collaboration with Dr Lavera Guise as a part of collaborative care agreement  Time spent: 30 Minutes

## 2019-02-26 ENCOUNTER — Encounter: Payer: Self-pay | Admitting: Nurse Practitioner

## 2019-04-10 ENCOUNTER — Other Ambulatory Visit: Payer: Self-pay

## 2019-04-10 DIAGNOSIS — E1165 Type 2 diabetes mellitus with hyperglycemia: Secondary | ICD-10-CM

## 2019-04-10 MED ORDER — METFORMIN HCL 500 MG PO TABS: 500.0000 mg | ORAL_TABLET | Freq: Two times a day (BID) | ORAL | 2 refills | Status: DC

## 2019-05-25 ENCOUNTER — Encounter: Payer: Self-pay | Admitting: Adult Health

## 2019-05-25 ENCOUNTER — Other Ambulatory Visit: Payer: Self-pay

## 2019-05-25 ENCOUNTER — Ambulatory Visit (INDEPENDENT_AMBULATORY_CARE_PROVIDER_SITE_OTHER): Payer: BLUE CROSS/BLUE SHIELD | Admitting: Adult Health

## 2019-05-25 VITALS — BP 122/72 | HR 58 | Resp 16 | Ht 71.0 in | Wt 247.0 lb

## 2019-05-25 DIAGNOSIS — E1165 Type 2 diabetes mellitus with hyperglycemia: Secondary | ICD-10-CM | POA: Diagnosis not present

## 2019-05-25 DIAGNOSIS — R42 Dizziness and giddiness: Secondary | ICD-10-CM | POA: Diagnosis not present

## 2019-05-25 DIAGNOSIS — D333 Benign neoplasm of cranial nerves: Secondary | ICD-10-CM

## 2019-05-25 DIAGNOSIS — N528 Other male erectile dysfunction: Secondary | ICD-10-CM

## 2019-05-25 LAB — POCT GLYCOSYLATED HEMOGLOBIN (HGB A1C): Hemoglobin A1C: 5.7 % — AB (ref 4.0–5.6)

## 2019-05-25 MED ORDER — SILDENAFIL CITRATE 100 MG PO TABS: 50.0000 mg | ORAL_TABLET | ORAL | 0 refills | Status: DC

## 2019-05-25 NOTE — Progress Notes (Signed)
[facility] [place],  [address]  Internal MEDICINE  Office Visit Note  Patient Name: Jonathan Herring  [id number]  [id number]  Date of Service: 05/25/2019  Chief Complaint  Patient presents with  . Follow-up    3 month fu  . Diabetes    check hgba1c  . Medication Management    has a question about slidenafil    HPI  Pt is here for follow up on DM.  His A1C today is 5.7.  He is very excited about that.  He continues to take his metformin and is doing well with his diet.  He also is concerned because he has been using sildenafil intermittently and has even increased to 100mg  dose with no results.  He would like to see a urologist.    Current Medication: Outpatient Encounter Medications as of 05/25/2019  Medication Sig  . meclizine (ANTIVERT) 25 MG tablet Take 1 tablet (25 mg total) by mouth 3 (three) times daily as needed.  . metFORMIN (GLUCOPHAGE) 500 MG tablet Take 1 tablet (500 mg total) by mouth 2 (two) times daily with a meal.  . ondansetron (ZOFRAN) 4 MG tablet Take 1 tablet (4 mg total) by mouth every 8 (eight) hours as needed for nausea or vomiting.  . sildenafil (VIAGRA) 100 MG tablet Take 50 mg by mouth as directed.  . [DISCONTINUED] fluticasone (FLONASE) 50 MCG/ACT nasal spray Place 2 sprays into the nose daily.  . [DISCONTINUED] terbinafine (LAMISIL) 250 MG tablet Take 250 mg by mouth daily.   No facility-administered encounter medications on file as of 05/25/2019.     Surgical History: Past Surgical History:  Procedure Laterality Date  . HERNIA REPAIR      Medical History: Past Medical History:  Diagnosis Date  . Diabetes mellitus without complication ([place])   . Vertigo     Family History: History reviewed. No pertinent family history.  Social History   Socioeconomic History  . Marital status: Single    Spouse name: Not on file  . Number of children: Not on file  . Years of education: Not on file  . Highest education  level: Not on file  Occupational History  . Not on file  Social Needs  . Financial resource strain: Not on file  . Food insecurity    Worry: Not on file    Inability: Not on file  . Transportation needs    Medical: Not on file    Non-medical: Not on file  Tobacco Use  . Smoking status: Never Smoker  . Smokeless tobacco: Never Used  Substance and Sexual Activity  . Alcohol use: No  . Drug use: No  . Sexual activity: Not on file  Lifestyle  . Physical activity    Days per week: Not on file    Minutes per session: Not on file  . Stress: Not on file  Relationships  . Social Herbalist on phone: Not on file    Gets together: Not on file    Attends religious service: Not on file    Active member of club or organization: Not on file    Attends meetings of clubs or organizations: Not on file    Relationship status: Not on file  . Intimate partner violence    Fear of current or ex partner: Not on file    Emotionally abused: Not on file    Physically abused: Not on file    Forced sexual activity: Not  on file  Other Topics Concern  . Not on file  Social History Narrative  . Not on file      Review of Systems  Constitutional: Negative.  Negative for chills, fatigue and unexpected weight change.  HENT: Negative.  Negative for congestion, rhinorrhea, sneezing and sore throat.   Eyes: Negative for redness.  Respiratory: Negative.  Negative for cough, chest tightness and shortness of breath.   Cardiovascular: Negative.  Negative for chest pain and palpitations.  Gastrointestinal: Negative.  Negative for abdominal pain, constipation, diarrhea, nausea and vomiting.  Endocrine: Negative.   Genitourinary: Negative.  Negative for dysuria and frequency.  Musculoskeletal: Negative.  Negative for arthralgias, back pain, joint swelling and neck pain.  Skin: Negative.  Negative for rash.  Allergic/Immunologic: Negative.   Neurological: Negative.  Negative for tremors and  numbness.  Hematological: Negative for adenopathy. Does not bruise/bleed easily.  Psychiatric/Behavioral: Negative.  Negative for behavioral problems, sleep disturbance and suicidal ideas. The patient is not nervous/anxious.     Vital Signs: BP 122/72   Pulse (!) 58   Resp 16   Ht 5\' 11"  (1.803 m)   Wt 247 lb (112 kg)   SpO2 99%   BMI 34.45 kg/m    Physical Exam Vitals signs and nursing note reviewed.  Constitutional:      General: He is not in acute distress.    Appearance: He is well-developed. He is not diaphoretic.  HENT:     Head: Normocephalic and atraumatic.     Mouth/Throat:     Pharynx: No oropharyngeal exudate.  Eyes:     Pupils: Pupils are equal, round, and reactive to light.  Neck:     Musculoskeletal: Normal range of motion and neck supple.     Thyroid: No thyromegaly.     Vascular: No JVD.     Trachea: No tracheal deviation.  Cardiovascular:     Rate and Rhythm: Normal rate and regular rhythm.     Heart sounds: Normal heart sounds. No murmur. No friction rub. No gallop.   Pulmonary:     Effort: Pulmonary effort is normal. No respiratory distress.     Breath sounds: Normal breath sounds. No wheezing or rales.  Chest:     Chest wall: No tenderness.  Abdominal:     Palpations: Abdomen is soft.     Tenderness: There is no abdominal tenderness. There is no guarding.  Musculoskeletal: Normal range of motion.  Lymphadenopathy:     Cervical: No cervical adenopathy.  Skin:    General: Skin is warm and dry.  Neurological:     Mental Status: He is alert and oriented to person, place, and time.     Cranial Nerves: No cranial nerve deficit.  Psychiatric:        Behavior: Behavior normal.        Thought Content: Thought content normal.        Judgment: Judgment normal.    Assessment/Plan: 1. Type 2 diabetes mellitus with hyperglycemia, unspecified whether long term insulin use (HCC) A1C is 5.7 today. Continue to take metformin as directed.  - POCT HgB  A1C  2. Acoustic neuroma (HCC) Continue to use meclizine as directed, follow up with ENT as before.   3. Intermittent vertigo Continue to use meclizine, and follow up with ENT as before   4. Other male erectile dysfunction Referral to urology per patient request, and refilled Viagra at this time.  - Ambulatory referral to Urology - sildenafil (VIAGRA) 100 MG tablet; Take  0.5 tablets (50 mg total) by mouth as directed.  Dispense: 10 tablet; Refill: 0  General Counseling: brydan downard understanding of the findings of todays visit and agrees with plan of treatment. I have discussed any further diagnostic evaluation that may be needed or ordered today. We also reviewed his medications today. he has been encouraged to call the office with any questions or concerns that should arise related to todays visit.    Orders Placed This Encounter  Procedures  . POCT HgB A1C    No orders of the defined types were placed in this encounter.   Time spent: 25 Minutes   This patient was seen by Orson Gear AGNP-C in Collaboration with Dr Lavera Guise as a part of collaborative care agreement     Kendell Bane AGNP-C Internal medicine

## 2019-07-03 ENCOUNTER — Ambulatory Visit: Payer: Self-pay | Admitting: Urology

## 2019-07-04 ENCOUNTER — Ambulatory Visit: Payer: BLUE CROSS/BLUE SHIELD | Admitting: Urology

## 2019-07-04 ENCOUNTER — Other Ambulatory Visit: Payer: Self-pay

## 2019-07-04 ENCOUNTER — Encounter: Payer: Self-pay | Admitting: Urology

## 2019-07-04 VITALS — BP 158/95 | HR 121 | Ht 71.0 in | Wt 247.2 lb

## 2019-07-04 DIAGNOSIS — N521 Erectile dysfunction due to diseases classified elsewhere: Secondary | ICD-10-CM

## 2019-07-04 MED ORDER — TADALAFIL 20 MG PO TABS: ORAL_TABLET | ORAL | 0 refills | Status: DC

## 2019-07-04 NOTE — Progress Notes (Signed)
07/04/2019 1:52 PM   Jonathan Herring. September 26, 1966 [id number]  Referring provider: Kendell Bane, NP [place],   [id number]  Chief Complaint  Patient presents with  . Erectile Dysfunction    HPI: 53 y.o. male seen in consultation at the request of Orson Gear, NP for evaluation of erectile dysfunction.  He states he has only had intercourse 1 time 30 years ago which was successful.  He recently attempted intercourse and he had difficulty achieving and maintaining an erection.  Over the last 5 years he has noted decrease difficulty achieving an erection with masturbation.  He has used sildenafil which has not been effective but it looks like he only took 25 mg.  Organic risk factors include type 2 diabetes on metformin.  He denies pain or curvature with erections.  His libido is intact and he denies tiredness or fatigue.  Previous testosterone has been normal by report.  He has no bothersome lower urinary tract symptoms.  PSA March 2020 was 1.09.   PMH: Past Medical History:  Diagnosis Date  . Diabetes mellitus without complication ([place])   . ED (erectile dysfunction)   . Vertigo     Surgical History: Past Surgical History:  Procedure Laterality Date  . HERNIA REPAIR      Home Medications:  Allergies as of 07/04/2019   No Known Allergies     Medication List       Accurate as of July 04, 2019  1:52 PM. If you have any questions, ask your nurse or doctor.        Contour Next Test test strip Generic drug: glucose blood   meclizine 25 MG tablet Commonly known as: ANTIVERT Take 1 tablet (25 mg total) by mouth 3 (three) times daily as needed.   metFORMIN 500 MG tablet Commonly known as: Glucophage Take 1 tablet (500 mg total) by mouth 2 (two) times daily with a meal.   ondansetron 4 MG tablet Commonly known as: Zofran Take 1 tablet (4 mg total) by mouth every 8 (eight) hours as needed for nausea or vomiting.   sertraline 50 MG tablet  Commonly known as: ZOLOFT Take by mouth.   sildenafil 100 MG tablet Commonly known as: VIAGRA Take 0.5 tablets (50 mg total) by mouth as directed.       Allergies: No Known Allergies  Family History: No family history on file.  Social History:  reports that he has never smoked. He has never used smokeless tobacco. He reports that he does not drink alcohol or use drugs.  ROS: UROLOGY Frequent Urination?: No Hard to postpone urination?: No Burning/pain with urination?: No Get up at night to urinate?: No Leakage of urine?: No Urine stream starts and stops?: No Trouble starting stream?: No Do you have to strain to urinate?: No Blood in urine?: No Urinary tract infection?: No Sexually transmitted disease?: No Injury to kidneys or bladder?: No Painful intercourse?: No Weak stream?: No Erection problems?: Yes Penile pain?: No  Gastrointestinal Nausea?: No Vomiting?: No Indigestion/heartburn?: No Diarrhea?: No Constipation?: No  Constitutional Fever: No Night sweats?: No Weight loss?: No Fatigue?: No  Skin Skin rash/lesions?: No Itching?: No  Eyes Blurred vision?: No Double vision?: No  Ears/Nose/Throat Sore throat?: No Sinus problems?: No  Hematologic/Lymphatic Swollen glands?: No Easy bruising?: No  Cardiovascular Leg swelling?: No Chest pain?: No  Respiratory Cough?: No Shortness of breath?: No  Endocrine Excessive thirst?: No  Musculoskeletal Back pain?: No Joint pain?: No  Neurological Headaches?:  No Dizziness?: No  Psychologic Depression?: No Anxiety?: No  Physical Exam: BP (!) 158/95 (BP Location: Left Arm, Patient Position: Sitting, Cuff Size: Normal)   Pulse (!) 121   Ht 5\' 11"  (1.803 m)   Wt 247 lb 3.2 oz (112.1 kg)   BMI 34.48 kg/m   Constitutional:  Alert and oriented, No acute distress. HEENT: [place] AT, moist mucus membranes.  Trachea midline, no masses. Cardiovascular: No clubbing, cyanosis, or edema. Respiratory:  Normal respiratory effort, no increased work of breathing. GI: Abdomen is soft, nontender, nondistended, no abdominal masses GU: Phallus without lesions, testes descended bilaterally without masses or tenderness.  Spermatic cord/epididymis palpably normal bilaterally. Skin: No rashes, bruises or suspicious lesions. Neurologic: Grossly intact, no focal deficits, moving all 4 extremities. Psychiatric: Normal mood and affect.   Assessment & Plan:    - Erectile dysfunction He has only attempted intercourse twice in the last 30 years the first time successful his most recent time has not been successful.  He notes decreased erections with masturbation over the last 5 years.  He is interested in starting a sexual relationship.  He was interested in trial of Cialis.  Also discussed intracavernosal injections and he was given literature. He will call back regarding efficacy of tadalafil.   Abbie Sons, [place] [address], [place], [place] [address] [phone]

## 2019-08-04 ENCOUNTER — Other Ambulatory Visit: Payer: Self-pay | Admitting: Adult Health

## 2019-08-04 DIAGNOSIS — E1165 Type 2 diabetes mellitus with hyperglycemia: Secondary | ICD-10-CM

## 2019-10-18 ENCOUNTER — Other Ambulatory Visit: Payer: Self-pay | Admitting: Adult Health

## 2019-11-22 ENCOUNTER — Telehealth: Payer: Self-pay

## 2019-11-22 NOTE — Telephone Encounter (Signed)
Rescheduled appointment on 11/23/2019 to 11/26/2019. klh

## 2019-11-26 ENCOUNTER — Ambulatory Visit: Payer: BLUE CROSS/BLUE SHIELD | Admitting: Adult Health

## 2019-11-26 ENCOUNTER — Telehealth: Payer: Self-pay

## 2019-11-26 NOTE — Telephone Encounter (Signed)
Confirmed appointment on 11/28/2019 with patients wife asked for patient to call for covid screening. klh

## 2019-11-28 ENCOUNTER — Other Ambulatory Visit: Payer: Self-pay

## 2019-11-28 ENCOUNTER — Encounter: Payer: Self-pay | Admitting: Adult Health

## 2019-11-28 ENCOUNTER — Ambulatory Visit: Payer: BLUE CROSS/BLUE SHIELD | Admitting: Adult Health

## 2019-11-28 VITALS — BP 148/75 | HR 61 | Temp 95.7°F | Ht 70.0 in | Wt 233.6 lb

## 2019-11-28 DIAGNOSIS — D333 Benign neoplasm of cranial nerves: Secondary | ICD-10-CM

## 2019-11-28 DIAGNOSIS — E1165 Type 2 diabetes mellitus with hyperglycemia: Secondary | ICD-10-CM | POA: Diagnosis not present

## 2019-11-28 DIAGNOSIS — N528 Other male erectile dysfunction: Secondary | ICD-10-CM

## 2019-11-28 LAB — POCT GLYCOSYLATED HEMOGLOBIN (HGB A1C): Hemoglobin A1C: 5.6 % (ref 4.0–5.6)

## 2019-11-28 MED ORDER — METFORMIN HCL 500 MG PO TABS: ORAL_TABLET | ORAL | 2 refills | Status: DC

## 2019-11-28 NOTE — Progress Notes (Signed)
[facility] [place], [place] [address]  Internal MEDICINE  Office Visit Note  Patient Name: Jonathan Herring  [id number]  [id number]  Date of Service: 11/28/2019  Chief Complaint  Patient presents with  . Diabetes    HPI Patient is here today to follow-up on his DM. Today his A1C is 5.6, which has slightly improved since his last check 6 months prior. He reports today that in the last two weeks he has stopped taking his Metformin twice a day and has dropped day to only once daily, current dose 500 mg daily. We are agreeable to try this dose and to keep with his current lifestyle of healthy eating and exercising. He has shown compliance and dedication to a healthy lifestyle in the past. We discussed that since he has recently changed his medication he needs to start monitoring his blood glucose levels at home to monitor for hyperglycemia. He has no acute issues to report today. Denies chest pain, headaches or shortness of breath.    Current Medication: Outpatient Encounter Medications as of 11/28/2019  Medication Sig  . CONTOUR NEXT TEST test strip   . fluticasone (FLONASE) 50 MCG/ACT nasal spray PLACE 2 SPRAYS INTO BOTH NOSTRILS DAILY  . meclizine (ANTIVERT) 25 MG tablet Take 1 tablet (25 mg total) by mouth 3 (three) times daily as needed.  . metFORMIN (GLUCOPHAGE) 500 MG tablet TAKE ONE TABLET TWICE DAILY WITH A MEAL  . ondansetron (ZOFRAN) 4 MG tablet Take 1 tablet (4 mg total) by mouth every 8 (eight) hours as needed for nausea or vomiting.  . sertraline (ZOLOFT) 50 MG tablet Take by mouth.  . [DISCONTINUED] metFORMIN (GLUCOPHAGE) 500 MG tablet TAKE ONE TABLET TWICE DAILY WITH A MEAL  . [DISCONTINUED] sildenafil (VIAGRA) 100 MG tablet Take 0.5 tablets (50 mg total) by mouth as directed. (Patient not taking: Reported on 11/28/2019)  . [DISCONTINUED] tadalafil (CIALIS) 20 MG tablet 1 tablet 1 hour prior to intercourse   No facility-administered encounter  medications on file as of 11/28/2019.    Surgical History: Past Surgical History:  Procedure Laterality Date  . HERNIA REPAIR      Medical History: Past Medical History:  Diagnosis Date  . Diabetes mellitus without complication ([place])   . ED (erectile dysfunction)   . Vertigo     Family History: History reviewed. No pertinent family history.  Social History   Socioeconomic History  . Marital status: Single    Spouse name: Not on file  . Number of children: Not on file  . Years of education: Not on file  . Highest education level: Not on file  Occupational History  . Not on file  Tobacco Use  . Smoking status: Never Smoker  . Smokeless tobacco: Never Used  Substance and Sexual Activity  . Alcohol use: No  . Drug use: No  . Sexual activity: Yes    Birth control/protection: None  Other Topics Concern  . Not on file  Social History Narrative  . Not on file   Social Determinants of Health   Financial Resource Strain:   . Difficulty of Paying Living Expenses: Not on file  Food Insecurity:   . Worried About Charity fundraiser in the Last Year: Not on file  . Ran Out of Food in the Last Year: Not on file  Transportation Needs:   . Lack of Transportation (Medical): Not on file  . Lack of Transportation (Non-Medical): Not on file  Physical Activity:   .  Days of Exercise per Week: Not on file  . Minutes of Exercise per Session: Not on file  Stress:   . Feeling of Stress : Not on file  Social Connections:   . Frequency of Communication with Friends and Family: Not on file  . Frequency of Social Gatherings with Friends and Family: Not on file  . Attends Religious Services: Not on file  . Active Member of Clubs or Organizations: Not on file  . Attends Archivist Meetings: Not on file  . Marital Status: Not on file  Intimate Partner Violence:   . Fear of Current or Ex-Partner: Not on file  . Emotionally Abused: Not on file  . Physically Abused: Not on  file  . Sexually Abused: Not on file      Review of Systems  Constitutional: Negative.  Negative for chills, fatigue and unexpected weight change.  HENT: Negative.  Negative for congestion, rhinorrhea, sneezing and sore throat.   Eyes: Negative for redness.  Respiratory: Negative.  Negative for cough, chest tightness and shortness of breath.   Cardiovascular: Negative.  Negative for chest pain and palpitations.  Gastrointestinal: Negative.  Negative for abdominal pain, constipation, diarrhea, nausea and vomiting.  Endocrine: Negative.   Genitourinary: Negative.  Negative for dysuria and frequency.  Musculoskeletal: Negative.  Negative for arthralgias, back pain, joint swelling and neck pain.  Skin: Negative.  Negative for rash.  Allergic/Immunologic: Negative.   Neurological: Negative.  Negative for tremors and numbness.  Hematological: Negative for adenopathy. Does not bruise/bleed easily.  Psychiatric/Behavioral: Negative.  Negative for behavioral problems, sleep disturbance and suicidal ideas. The patient is not nervous/anxious.     Vital Signs: BP (!) 148/75   Pulse 61   Temp (!) 95.7 F (35.4 C)   Ht 5\' 10"  (1.778 m)   Wt 233 lb 9.6 oz (106 kg)   SpO2 99%   BMI 33.52 kg/m    Physical Exam Vitals and nursing note reviewed.  Constitutional:      General: He is not in acute distress.    Appearance: He is well-developed. He is not diaphoretic.  HENT:     Head: Normocephalic and atraumatic.     Mouth/Throat:     Pharynx: No oropharyngeal exudate.  Eyes:     Pupils: Pupils are equal, round, and reactive to light.  Neck:     Thyroid: No thyromegaly.     Vascular: No JVD.     Trachea: No tracheal deviation.  Cardiovascular:     Rate and Rhythm: Normal rate and regular rhythm.     Heart sounds: Normal heart sounds. No murmur. No friction rub. No gallop.   Pulmonary:     Effort: Pulmonary effort is normal. No respiratory distress.     Breath sounds: Normal breath  sounds. No wheezing or rales.  Chest:     Chest wall: No tenderness.  Abdominal:     Palpations: Abdomen is soft.     Tenderness: There is no abdominal tenderness. There is no guarding.  Musculoskeletal:        General: Normal range of motion.     Cervical back: Normal range of motion and neck supple.  Lymphadenopathy:     Cervical: No cervical adenopathy.  Skin:    General: Skin is warm and dry.  Neurological:     Mental Status: He is alert and oriented to person, place, and time.     Cranial Nerves: No cranial nerve deficit.  Psychiatric:  Behavior: Behavior normal.        Thought Content: Thought content normal.        Judgment: Judgment normal.     Assessment/Plan: 1. Type 2 diabetes mellitus with hyperglycemia, unspecified whether long term insulin use (HCC) A1C today 5.6, well controlled. Patient would like to start taking one 500 mg tablet daily, agrees to monitor his blood sugars regularly at home and will go back to his prescribed dose if blood sugars levels begin to rise. Diabetic foot exam performed today, no issues. Will follow-up with repeat A1C in 6 months. - POCT HgB A1C - metFORMIN (GLUCOPHAGE) 500 MG tablet; TAKE ONE TABLET TWICE DAILY WITH A MEAL  Dispense: 180 tablet; Refill: 2  2. Acoustic neuroma ([place]) Stable at this time, followed by otolaryngology, continue to monitor.  3. Other male erectile dysfunction Stable at this time, followed by urology, continue to monitor.   General Counseling: friedrich aldridge understanding of the findings of todays visit and agrees with plan of treatment. I have discussed any further diagnostic evaluation that may be needed or ordered today. We also reviewed his medications today. he has been encouraged to call the office with any questions or concerns that should arise related to todays visit.    Orders Placed This Encounter  Procedures  . POCT HgB A1C    Meds ordered this encounter  Medications  . metFORMIN  (GLUCOPHAGE) 500 MG tablet    Sig: TAKE ONE TABLET TWICE DAILY WITH A MEAL    Dispense:  180 tablet    Refill:  2    Time spent: 20 Minutes   This patient was seen by Orson Gear AGNP-C in Collaboration with Dr Lavera Guise as a part of collaborative care agreement     Kendell Bane AGNP-C Internal medicine

## 2020-05-26 ENCOUNTER — Telehealth: Payer: Self-pay

## 2020-05-26 NOTE — Telephone Encounter (Signed)
LMOM of office visit 8/11

## 2020-05-28 ENCOUNTER — Ambulatory Visit: Payer: BLUE CROSS/BLUE SHIELD | Admitting: Adult Health

## 2021-07-27 ENCOUNTER — Emergency Department: Payer: 59

## 2021-07-27 ENCOUNTER — Other Ambulatory Visit: Payer: Self-pay

## 2021-07-27 ENCOUNTER — Emergency Department
Admission: EM | Admit: 2021-07-27 | Discharge: 2021-07-27 | Disposition: A | Discharge status: Home / Self Care | Payer: 59 | Attending: Student in an Organized Health Care Education/Training Program | Admitting: Student in an Organized Health Care Education/Training Program

## 2021-07-27 DIAGNOSIS — K4090 Unilateral inguinal hernia, without obstruction or gangrene, not specified as recurrent: Secondary | ICD-10-CM | POA: Diagnosis not present

## 2021-07-27 DIAGNOSIS — R55 Syncope and collapse: Secondary | ICD-10-CM | POA: Insufficient documentation

## 2021-07-27 DIAGNOSIS — E119 Type 2 diabetes mellitus without complications: Secondary | ICD-10-CM | POA: Insufficient documentation

## 2021-07-27 DIAGNOSIS — R1032 Left lower quadrant pain: Secondary | ICD-10-CM

## 2021-07-27 DIAGNOSIS — Z7984 Long term (current) use of oral hypoglycemic drugs: Secondary | ICD-10-CM | POA: Insufficient documentation

## 2021-07-27 DIAGNOSIS — K439 Ventral hernia without obstruction or gangrene: Secondary | ICD-10-CM | POA: Diagnosis not present

## 2021-07-27 LAB — CBC
HCT: 42.6 % (ref 39.0–52.0)
Hemoglobin: 15.3 g/dL (ref 13.0–17.0)
MCH: 32.3 pg (ref 26.0–34.0)
MCHC: 35.9 g/dL (ref 30.0–36.0)
MCV: 89.9 fL (ref 80.0–100.0)
Platelets: 215 10*3/uL (ref 150–400)
RBC: 4.74 MIL/uL (ref 4.22–5.81)
RDW: 13.2 % (ref 11.5–15.5)
WBC: 6.1 10*3/uL (ref 4.0–10.5)
nRBC: 0 % (ref 0.0–0.2)

## 2021-07-27 LAB — URINALYSIS, COMPLETE (UACMP) WITH MICROSCOPIC
Bacteria, UA: NONE SEEN
Bilirubin Urine: NEGATIVE
Glucose, UA: NEGATIVE mg/dL
Ketones, ur: NEGATIVE mg/dL
Leukocytes,Ua: NEGATIVE
Nitrite: NEGATIVE
Protein, ur: NEGATIVE mg/dL
Specific Gravity, Urine: 1.006 (ref 1.005–1.030)
Squamous Epithelial / HPF: NONE SEEN (ref 0–5)
pH: 6 (ref 5.0–8.0)

## 2021-07-27 LAB — COMPREHENSIVE METABOLIC PANEL
ALT: 18 U/L (ref 0–44)
AST: 14 U/L — ABNORMAL LOW (ref 15–41)
Albumin: 3.9 g/dL (ref 3.5–5.0)
Alkaline Phosphatase: 47 U/L (ref 38–126)
Anion gap: 6 (ref 5–15)
BUN: 18 mg/dL (ref 6–20)
CO2: 29 mmol/L (ref 22–32)
Calcium: 9 mg/dL (ref 8.9–10.3)
Chloride: 100 mmol/L (ref 98–111)
Creatinine, Ser: 0.85 mg/dL (ref 0.61–1.24)
GFR, Estimated: >60 mL/min (ref >60)
Glucose, Bld: 178 mg/dL — ABNORMAL HIGH (ref 70–99)
Potassium: 4.3 mmol/L (ref 3.5–5.1)
Sodium: 135 mmol/L (ref 135–145)
Total Bilirubin: 0.9 mg/dL (ref 0.3–1.2)
Total Protein: 7 g/dL (ref 6.5–8.1)

## 2021-07-27 MED ORDER — NAPROXEN 500 MG PO TABS: 500.0000 mg | ORAL_TABLET | Freq: Two times a day (BID) | ORAL | 0 refills | Status: DC

## 2021-07-27 MED ORDER — POLYETHYLENE GLYCOL 3350 17 G PO PACK: 17.0000 g | PACK | Freq: Every day | ORAL | 0 refills | Status: DC

## 2021-07-27 MED ORDER — ONDANSETRON HCL 4 MG/2ML IJ SOLN
4.0000 mg | Freq: Once | INTRAMUSCULAR | Status: AC
  Administered 2021-07-27: 4 mg via INTRAVENOUS
  Filled 2021-07-27: qty 2

## 2021-07-27 MED ORDER — MORPHINE SULFATE (PF) 4 MG/ML IV SOLN
4.0000 mg | INTRAVENOUS | Status: DC | PRN
  Administered 2021-07-27: 4 mg via INTRAVENOUS
  Filled 2021-07-27: qty 1

## 2021-07-27 MED ORDER — HYDROCODONE-ACETAMINOPHEN 5-325 MG PO TABS: 1.0000 | ORAL_TABLET | ORAL | 0 refills | Status: DC | PRN

## 2021-07-27 NOTE — ED Provider Notes (Signed)
[facility] Emergency Department Provider Note    Event Date/Time   First MD Initiated Contact with Patient 07/27/21 [phone]     (approximate)  I have reviewed the triage vital signs and the nursing notes.   HISTORY  Chief Complaint Near Syncope and Abdominal Pain    HPI Jonathan Lall. is a 55 y.o. male below listed past medical history presents to the ER for evaluation of sudden onset left groin pain occurred while he was waiting in line for a table at a restaurant today.  No heavy lifting.  Does not have testicular pain.  Has never had pain like this before.  States it was very severe about an hour ago and is since subsided is a little bit more of a pressure like pain.  Denies any nausea or vomiting.  No measured fevers no dysuria.  Past Medical History:  Diagnosis Date   Diabetes mellitus without complication [facility])    ED (erectile dysfunction)    Vertigo    No family history on file. Past Surgical History:  Procedure Laterality Date   HERNIA REPAIR     Patient Active Problem List   Diagnosis Date Noted   Diabetes mellitus without complication ([place]) 83/38/2505   Erectile dysfunction 10/26/2018   Asymmetrical right sensorineural hearing loss 07/04/2018   Elevated blood pressure, situational 03/05/2018   Obesity, unspecified 08/04/2017   Acoustic neuroma ([place]) 07/11/2017   Elevated PSA measurement 05/31/2017   Intermittent vertigo 05/31/2017   Type 2 diabetes mellitus with complication, without long-term current use of insulin ([place]) 05/31/2017      Prior to Admission medications   Medication Sig Start Date End Date Taking? Authorizing Provider  Apoaequorin (PREVAGEN PO) Take 1 capsule by mouth daily.   Yes [provider]  HYDROcodone-acetaminophen (NORCO) 5-325 MG tablet Take 1 tablet by mouth every 4 (four) hours as needed for moderate pain. 07/27/21  Yes Merlyn Lot, MD  metFORMIN (GLUCOPHAGE) 500 MG tablet TAKE ONE TABLET  TWICE DAILY WITH A MEAL 11/28/19  Yes [place], Audie Clear, NP  Multiple Vitamin (MULTIVITAMIN WITH MINERALS) TABS tablet Take 1 tablet by mouth daily.   Yes [provider]  naproxen (NAPROSYN) 500 MG tablet Take 1 tablet (500 mg total) by mouth 2 (two) times daily with a meal. 07/27/21 07/27/22 Yes Merlyn Lot, MD  polyethylene glycol (MIRALAX / GLYCOLAX) 17 g packet Take 17 g by mouth daily. Mix one tablespoon with 8oz of your favorite juice or water every day until you are having soft formed stools. Then start taking once daily if you didn't have a stool the day before. 07/27/21  Yes Merlyn Lot, MD  fluticasone [facility]) 50 MCG/ACT nasal spray PLACE 2 SPRAYS INTO BOTH NOSTRILS DAILY Patient not taking: Reported on 07/27/2021 10/22/19   Kendell Bane, NP    Allergies Patient has no known allergies.    Social History Social History   Tobacco Use   Smoking status: Never   Smokeless tobacco: Never  Substance Use Topics   Alcohol use: No   Drug use: No    Review of Systems Patient denies headaches, rhinorrhea, blurry vision, numbness, shortness of breath, chest pain, edema, cough, abdominal pain, nausea, vomiting, diarrhea, dysuria, fevers, rashes or hallucinations unless otherwise stated above in HPI. ____________________________________________   PHYSICAL EXAM:  VITAL SIGNS: Vitals:   07/27/21 1025 07/27/21 1229  BP:  134/66  Pulse:  86  Resp:  17  Temp:    SpO2: 99% 99%  Constitutional: Alert and oriented.  Eyes: Conjunctivae are normal.  Head: Atraumatic. Nose: No congestion/rhinnorhea. Mouth/Throat: Mucous membranes are moist.   Neck: No stridor. Painless ROM.  Cardiovascular: Normal rate, regular rhythm. Grossly normal heart sounds.  Good peripheral circulation. Respiratory: Normal respiratory effort.  No retractions. Lungs CTAB. Gastrointestinal: Soft and nontender. No distention. No abdominal bruits. No CVA tenderness. Genitourinary: Large  left inguinal hernia mildly tender to palpation is reducible. Musculoskeletal: No lower extremity tenderness nor edema.  No joint effusions. Neurologic:  Normal speech and language. No gross focal neurologic deficits are appreciated. No facial droop Skin:  Skin is warm, dry and intact. No rash noted. Psychiatric: Mood and affect are normal. Speech and behavior are normal.  ____________________________________________   LABS (all labs ordered are listed, but only abnormal results are displayed)  Results for orders placed or performed during the hospital encounter of 07/27/21 (from the past 24 hour(s))  CBC     Status: None   Collection Time: 07/27/21 10:05 AM  Result Value Ref Range   WBC 6.1 4.0 - 10.5 K/uL   RBC 4.74 4.22 - 5.81 MIL/uL   Hemoglobin 15.3 13.0 - 17.0 g/dL   HCT 42.6 39.0 - 52.0 %   MCV 89.9 80.0 - 100.0 fL   MCH 32.3 26.0 - 34.0 pg   MCHC 35.9 30.0 - 36.0 g/dL   RDW 13.2 11.5 - 15.5 %   Platelets 215 150 - 400 K/uL   nRBC 0.0 0.0 - 0.2 %  Comprehensive metabolic panel     Status: Abnormal   Collection Time: 07/27/21 10:05 AM  Result Value Ref Range   Sodium 135 135 - 145 mmol/L   Potassium 4.3 3.5 - 5.1 mmol/L   Chloride 100 98 - 111 mmol/L   CO2 29 22 - 32 mmol/L   Glucose, Bld 178 (H) 70 - 99 mg/dL   BUN 18 6 - 20 mg/dL   Creatinine, Ser 0.85 0.61 - 1.24 mg/dL   Calcium 9.0 8.9 - 10.3 mg/dL   Total Protein 7.0 6.5 - 8.1 g/dL   Albumin 3.9 3.5 - 5.0 g/dL   AST 14 (L) 15 - 41 U/L   ALT 18 0 - 44 U/L   Alkaline Phosphatase 47 38 - 126 U/L   Total Bilirubin 0.9 0.3 - 1.2 mg/dL   GFR, Estimated >60 >60 mL/min   Anion gap 6 5 - 15  Urinalysis, Complete w Microscopic     Status: Abnormal   Collection Time: 07/27/21 12:15 PM  Result Value Ref Range   Color, Urine STRAW (A) YELLOW   APPearance CLEAR (A) CLEAR   Specific Gravity, Urine 1.006 1.005 - 1.030   pH 6.0 5.0 - 8.0   Glucose, UA NEGATIVE NEGATIVE mg/dL   Hgb urine dipstick SMALL (A) NEGATIVE    Bilirubin Urine NEGATIVE NEGATIVE   Ketones, ur NEGATIVE NEGATIVE mg/dL   Protein, ur NEGATIVE NEGATIVE mg/dL   Nitrite NEGATIVE NEGATIVE   Leukocytes,Ua NEGATIVE NEGATIVE   RBC / HPF 0-5 0 - 5 RBC/hpf   WBC, UA 0-5 0 - 5 WBC/hpf   Bacteria, UA NONE SEEN NONE SEEN   Squamous Epithelial / LPF NONE SEEN 0 - 5   Mucus PRESENT    Hyaline Casts, UA PRESENT    ____________________________________________  EKG My review and personal interpretation at Time: 9:45   Indication: groin pain  Rate: 70  Rhythm: sinus Axis: normal Other: normal intervals, no stemi ____________________________________________  RADIOLOGY  I personally reviewed all  radiographic images ordered to evaluate for the above acute complaints and reviewed radiology reports and findings.  These findings were personally discussed with the patient.  Please see medical record for radiology report.  ____________________________________________   PROCEDURES  Procedure(s) performed:  Procedures    Critical Care performed: no ____________________________________________   INITIAL IMPRESSION / ASSESSMENT AND PLAN / ED COURSE  Pertinent labs & imaging results that were available during my care of the patient were reviewed by me and considered in my medical decision making (see chart for details).   DDX: Hernia, torsion, stone, diverticulitis, musculoskeletal strain  Gonzella Lex. is a 55 y.o. who presents to the ED with presentation as described above.  Afebrile hemodynamically stable.  Blood work as well as CT imaging will be ordered for above differential.  My review of the CT does appear consistent with left inguinal hernia.  No evidence of obstructive pattern.  Will give IV narcotic medication and attempt reduction.  Clinical Course as of 07/27/21 1251  Mon Jul 27, 2021  1241 Inguinal hernia was reduced successfully.  Patient pain-free.  Does appear stable appropriate for outpatient follow-up. [PR]    Clinical  Course User Index [PR] Merlyn Lot, MD    The patient was evaluated in Emergency Department today for the symptoms described in the history of present illness. He/she was evaluated in the context of the global COVID-19 pandemic, which necessitated consideration that the patient might be at risk for infection with the SARS-CoV-2 virus that causes COVID-19. Institutional protocols and algorithms that pertain to the evaluation of patients at risk for COVID-19 are in a state of rapid change based on information released by regulatory bodies including the CDC and federal and state organizations. These policies and algorithms were followed during the patient's care in the ED.  As part of my medical decision making, I reviewed the following data within the [place] notes reviewed and incorporated, Labs reviewed, notes from prior ED visits and [place] Controlled Substance Database   ____________________________________________   FINAL CLINICAL IMPRESSION(S) / ED DIAGNOSES  Final diagnoses:  Non-recurrent unilateral inguinal hernia without obstruction or gangrene  Left groin pain      NEW MEDICATIONS STARTED DURING THIS VISIT:  New Prescriptions   HYDROCODONE-ACETAMINOPHEN (NORCO) 5-325 MG TABLET    Take 1 tablet by mouth every 4 (four) hours as needed for moderate pain.   NAPROXEN (NAPROSYN) 500 MG TABLET    Take 1 tablet (500 mg total) by mouth 2 (two) times daily with a meal.   POLYETHYLENE GLYCOL (MIRALAX / GLYCOLAX) 17 G PACKET    Take 17 g by mouth daily. Mix one tablespoon with 8oz of your favorite juice or water every day until you are having soft formed stools. Then start taking once daily if you didn't have a stool the day before.     Note:  This document was prepared using Dragon voice recognition software and may include unintentional dictation errors.    Merlyn Lot, MD 07/27/21 [phone]

## 2021-07-27 NOTE — ED Triage Notes (Signed)
Pt reports that he was at a restaurant this am and had sudden onset of left lower abd pain groin area pain, pt is pointing to the left lower suprapubic area, pt denies hx of kidney stones, pt the pain caused him to fall onto his back and nearly pass out

## 2021-07-27 NOTE — ED Notes (Signed)
Pt denies N/V and urinary symptoms.

## 2021-07-29 ENCOUNTER — Ambulatory Visit: Payer: Self-pay | Admitting: Surgery

## 2021-07-29 NOTE — H&P (View-Only) (Signed)
Subjective:   CC: Non-recurrent bilateral inguinal hernia without obstruction or gangrene [K40.20]   HPI:  Jonathan Herring is a 55 y.o. male who was referred by Quentin Cornwall for evaluation of above. Symptoms were first noted a few months ago. Pain is dull and intermittent, confined to the left groin, without radiation.  Associated with nothing specific, exacerbated by exertion.  Lump is reducible.    Past Medical History:  has a past medical history of Anxiety (7-13), Diabetes mellitus type 2, uncomplicated (CMS-HCC) (11-6832), Hypertension (7-17), Obesity, Shingles, and Sleep apnea (ongoing but not related to my stress).   Past Surgical History:  Past Surgical History: Procedure Laterality Date  HERNIA REPAIR   1962-22   Umbilical     Family History: family history includes No Known Problems in his father and mother.   Social History:  reports that he has never smoked. He has never used smokeless tobacco. He reports previous alcohol use. He reports that he does not use drugs.   Current Medications: has a current medication list which includes the following prescription(s): fluticasone propionate, hydrocodone-acetaminophen, meclizine, naproxen, polyethylene glycol, and tadalafil.   Allergies:  Allergies as of 07/29/2021  (No Known Allergies)     ROS:  A 15 point review of systems was performed and pertinent positives and negatives noted in HPI   Objective:   BP (!) 147/89   Pulse 77   Ht 180.3 cm (5\' 11" )   Wt (!) 105.7 kg (233 lb)   BMI 32.50 kg/m    Constitutional :  Alert, cooperative, no distress Lymphatics/Throat:  Supple, no lymphadenopathy Respiratory:  clear to auscultation bilaterally Cardiovascular:  regular rate and rhythm Gastrointestinal: soft, non-tender; bowel sounds normal; no masses,  no organomegaly. inguinal hernia noted.  large, incarcerated, no overlying skin changes and LEFT, possible right, likely bowel involvement Musculoskeletal: Steady gait and  movement Skin: Cool and moist, periumbilical surgical scars Psychiatric: Normal affect, non-agitated, not confused       LABS:  n/a    RADS: n/a Assessment:       Non-recurrent bilateral inguinal hernia without obstruction or gangrene [K40.20] LEFT, possible right   Hx of ventral hernia repair with mesh   Plan:   1. Non-recurrent bilateral inguinal hernia without obstruction or gangrene [K40.20]   Discussed the risk of surgery including recurrence, which can be up to 50% in the case of incisional or complex hernias, possible use of prosthetic materials (mesh) and the increased risk of mesh infxn if used, bleeding, chronic pain, post-op infxn, post-op SBO or ileus, and possible re-operation to address said risks. The risks of general anesthetic, if used, includes MI, CVA, sudden death or even reaction to anesthetic medications also discussed. Alternatives include continued observation.  Benefits include possible symptom relief, prevention of incarceration, strangulation, enlargement in size over time, and the risk of emergency surgery in the face of strangulation.    Typical post-op recovery time of 3-5 days with 2 weeks of activity restrictions were also discussed.   ED return precautions given for sudden increase in pain, size of hernia with accompanying fever, nausea, and/or vomiting.   The patient verbalized understanding and all questions were answered to the patient's satisfaction.     2. Patient has elected to proceed with surgical treatment. Procedure will be scheduled.  Written consent was obtained. left, possible bilateral, robotic assited. Possible conversion to open if ventral hernia mesh prevents laparoscopic repair.  Will only fix left if converted to open.  Foley intraop as  well

## 2021-07-29 NOTE — H&P (Signed)
Subjective:   CC: Non-recurrent bilateral inguinal hernia without obstruction or gangrene [K40.20]   HPI:  Jonathan Herring is a 55 y.o. male who was referred by Quentin Cornwall for evaluation of above. Symptoms were first noted a few months ago. Pain is dull and intermittent, confined to the left groin, without radiation.  Associated with nothing specific, exacerbated by exertion.  Lump is reducible.    Past Medical History:  has a past medical history of Anxiety (7-13), Diabetes mellitus type 2, uncomplicated (CMS-HCC) (11-7251), Hypertension (7-17), Obesity, Shingles, and Sleep apnea (ongoing but not related to my stress).   Past Surgical History:  Past Surgical History: Procedure Laterality Date  HERNIA REPAIR   6644-03   Umbilical     Family History: family history includes No Known Problems in his father and mother.   Social History:  reports that he has never smoked. He has never used smokeless tobacco. He reports previous alcohol use. He reports that he does not use drugs.   Current Medications: has a current medication list which includes the following prescription(s): fluticasone propionate, hydrocodone-acetaminophen, meclizine, naproxen, polyethylene glycol, and tadalafil.   Allergies:  Allergies as of 07/29/2021  (No Known Allergies)     ROS:  A 15 point review of systems was performed and pertinent positives and negatives noted in HPI   Objective:   BP (!) 147/89   Pulse 77   Ht 180.3 cm (5\' 11" )   Wt (!) 105.7 kg (233 lb)   BMI 32.50 kg/m    Constitutional :  Alert, cooperative, no distress Lymphatics/Throat:  Supple, no lymphadenopathy Respiratory:  clear to auscultation bilaterally Cardiovascular:  regular rate and rhythm Gastrointestinal: soft, non-tender; bowel sounds normal; no masses,  no organomegaly. inguinal hernia noted.  large, incarcerated, no overlying skin changes and LEFT, possible right, likely bowel involvement Musculoskeletal: Steady gait and  movement Skin: Cool and moist, periumbilical surgical scars Psychiatric: Normal affect, non-agitated, not confused       LABS:  n/a    RADS: n/a Assessment:       Non-recurrent bilateral inguinal hernia without obstruction or gangrene [K40.20] LEFT, possible right   Hx of ventral hernia repair with mesh   Plan:   1. Non-recurrent bilateral inguinal hernia without obstruction or gangrene [K40.20]   Discussed the risk of surgery including recurrence, which can be up to 50% in the case of incisional or complex hernias, possible use of prosthetic materials (mesh) and the increased risk of mesh infxn if used, bleeding, chronic pain, post-op infxn, post-op SBO or ileus, and possible re-operation to address said risks. The risks of general anesthetic, if used, includes MI, CVA, sudden death or even reaction to anesthetic medications also discussed. Alternatives include continued observation.  Benefits include possible symptom relief, prevention of incarceration, strangulation, enlargement in size over time, and the risk of emergency surgery in the face of strangulation.    Typical post-op recovery time of 3-5 days with 2 weeks of activity restrictions were also discussed.   ED return precautions given for sudden increase in pain, size of hernia with accompanying fever, nausea, and/or vomiting.   The patient verbalized understanding and all questions were answered to the patient's satisfaction.     2. Patient has elected to proceed with surgical treatment. Procedure will be scheduled.  Written consent was obtained. left, possible bilateral, robotic assited. Possible conversion to open if ventral hernia mesh prevents laparoscopic repair.  Will only fix left if converted to open.  Foley intraop as  well

## 2021-08-05 ENCOUNTER — Other Ambulatory Visit: Payer: Self-pay

## 2021-08-05 ENCOUNTER — Encounter
Admission: RE | Admit: 2021-08-05 | Discharge: 2021-08-05 | Disposition: A | Discharge status: Home / Self Care | Payer: 59 | Source: Ambulatory Visit | Attending: Surgery | Admitting: Surgery

## 2021-08-05 HISTORY — DX: Elevated blood-pressure reading, without diagnosis of hypertension: R03.0

## 2021-08-05 HISTORY — DX: Prediabetes: R73.03

## 2021-08-05 NOTE — Patient Instructions (Signed)
Your procedure is scheduled on:08-07-21 Friday Report to the Registration Desk on the 1st floor of the [facility].Then proceed to the 2nd floor Surgery Desk in the [facility] To find out your arrival time, please call [phone] between 1PM - 3PM on:08-06-21 Thursday  REMEMBER: Instructions that are not followed completely may result in serious medical risk, up to and including death; or upon the discretion of your surgeon and anesthesiologist your surgery may need to be rescheduled.  Do not eat food after midnight the night before surgery.  No gum chewing, lozengers or hard candies.  You may however, drink CLEAR liquids up to 2 hours before you are scheduled to arrive for your surgery. Do not drink anything within 2 hours of your scheduled arrival time.  Clear liquids include: - water  - apple juice without pulp - gatorade (not RED, PURPLE, OR BLUE) - black coffee or tea (Do NOT add milk or creamers to the coffee or tea) Do NOT drink anything that is not on this list.  Do not take any medication the day of surgery  One week prior to surgery: Stop Anti-inflammatories (NSAIDS) such as Advil, Aleve, Ibuprofen, Motrin, Naproxen, Naprosyn and Aspirin based products such as Excedrin, Goodys Powder, BC Powder.You may however, take Tylenol if needed for pain up until the day of surgery.  Stop ANY OVER THE COUNTER supplements/vitamins NOW (08-05-21) until after surgery (Apoaequorin (PREVAGEN PO) and  Multiple Vitamin (MULTIVITAMIN WITH MINERALS) TABS tablet)  No Alcohol for 24 hours before or after surgery.  No Smoking including e-cigarettes for 24 hours prior to surgery.  No chewable tobacco products for at least 6 hours prior to surgery.  No nicotine patches on the day of surgery.  Do not use any "recreational" drugs for at least a week prior to your surgery.  Please be advised that the combination of cocaine and anesthesia may have negative outcomes, up to and including death. If  you test positive for cocaine, your surgery will be cancelled.  On the morning of surgery brush your teeth with toothpaste and water, you may rinse your mouth with mouthwash if you wish. Do not swallow any toothpaste or mouthwash.  Use CHG Soap as directed on instruction sheet.  Do not wear jewelry, make-up, hairpins, clips or nail polish.  Do not wear lotions, powders, or perfumes.   Do not shave body from the neck down 48 hours prior to surgery just in case you cut yourself which could leave a site for infection.  Also, freshly shaved skin may become irritated if using the CHG soap.  Contact lenses, hearing aids and dentures may not be worn into surgery.  Do not bring valuables to the hospital. [facility] is not responsible for any missing/lost belongings or valuables.  Notify your doctor if there is any change in your medical condition (cold, fever, infection).  Wear comfortable clothing (specific to your surgery type) to the hospital.  After surgery, you can help prevent lung complications by doing breathing exercises.  Take deep breaths and cough every 1-2 hours. Your doctor may order a device called an Incentive Spirometer to help you take deep breaths. When coughing or sneezing, hold a pillow firmly against your incision with both hands. This is called "splinting." Doing this helps protect your incision. It also decreases belly discomfort.  If you are being admitted to the hospital overnight, leave your suitcase in the car. After surgery it may be brought to your room.  If you are being  discharged the day of surgery, you will not be allowed to drive home. You will need a responsible adult (18 years or older) to drive you home and stay with you that night.   If you are taking public transportation, you will need to have a responsible adult (18 years or older) with you. Please confirm with your physician that it is acceptable to use public transportation.   Please call the  [facility] at [phone] if you have any questions about these instructions.  Surgery Visitation Policy:  Patients undergoing a surgery or procedure may have one family member or support person with them as long as that person is not COVID-19 positive or experiencing its symptoms.  That person may remain in the waiting area during the procedure and may rotate out with other people.  Inpatient Visitation:    Visiting hours are 7 a.m. to 8 p.m. Up to two visitors ages 16+ are allowed at one time in a patient room. The visitors may rotate out with other people during the day. Visitors must check out when they leave, or other visitors will not be allowed. One designated support person may remain overnight. The visitor must pass COVID-19 screenings, use hand sanitizer when entering and exiting the patient's room and wear a mask at all times, including in the patient's room. Patients must also wear a mask when staff or their visitor are in the room. Masking is required regardless of vaccination status.

## 2021-08-07 ENCOUNTER — Encounter: Payer: Self-pay | Admitting: Surgery

## 2021-08-07 ENCOUNTER — Ambulatory Visit: Payer: 59 | Admitting: Certified Registered"

## 2021-08-07 ENCOUNTER — Ambulatory Visit
Admission: RE | Admit: 2021-08-07 | Discharge: 2021-08-07 | Disposition: A | Discharge status: Home / Self Care | Payer: 59 | Attending: Surgery | Admitting: Surgery

## 2021-08-07 ENCOUNTER — Encounter
Admission: RE | Disposition: A | Discharge status: Home / Self Care | Payer: Self-pay | Source: Home / Self Care | Attending: Surgery

## 2021-08-07 ENCOUNTER — Other Ambulatory Visit: Payer: Self-pay

## 2021-08-07 DIAGNOSIS — G4730 Sleep apnea, unspecified: Secondary | ICD-10-CM | POA: Insufficient documentation

## 2021-08-07 DIAGNOSIS — Z6833 Body mass index (BMI) 33.0-33.9, adult: Secondary | ICD-10-CM | POA: Diagnosis not present

## 2021-08-07 DIAGNOSIS — K4000 Bilateral inguinal hernia, with obstruction, without gangrene, not specified as recurrent: Secondary | ICD-10-CM | POA: Diagnosis not present

## 2021-08-07 DIAGNOSIS — K4020 Bilateral inguinal hernia, without obstruction or gangrene, not specified as recurrent: Secondary | ICD-10-CM | POA: Diagnosis present

## 2021-08-07 DIAGNOSIS — Z791 Long term (current) use of non-steroidal anti-inflammatories (NSAID): Secondary | ICD-10-CM | POA: Insufficient documentation

## 2021-08-07 DIAGNOSIS — Z79899 Other long term (current) drug therapy: Secondary | ICD-10-CM | POA: Diagnosis not present

## 2021-08-07 DIAGNOSIS — I10 Essential (primary) hypertension: Secondary | ICD-10-CM | POA: Insufficient documentation

## 2021-08-07 DIAGNOSIS — E669 Obesity, unspecified: Secondary | ICD-10-CM | POA: Insufficient documentation

## 2021-08-07 DIAGNOSIS — K432 Incisional hernia without obstruction or gangrene: Secondary | ICD-10-CM | POA: Diagnosis not present

## 2021-08-07 DIAGNOSIS — E119 Type 2 diabetes mellitus without complications: Secondary | ICD-10-CM | POA: Diagnosis not present

## 2021-08-07 HISTORY — PX: XI ROBOTIC ASSISTED INGUINAL HERNIA REPAIR WITH MESH: SHX6706

## 2021-08-07 LAB — GLUCOSE, CAPILLARY: Glucose-Capillary: 155 mg/dL — ABNORMAL HIGH (ref 70–99)

## 2021-08-07 SURGERY — REPAIR, HERNIA, INGUINAL, ROBOT-ASSISTED, LAPAROSCOPIC, USING MESH
Anesthesia: General | Site: Inguinal | Laterality: Bilateral

## 2021-08-07 MED ORDER — ONDANSETRON HCL 4 MG/2ML IJ SOLN
4.0000 mg | Freq: Once | INTRAMUSCULAR | Status: AC | PRN
  Administered 2021-08-07: 4 mg via INTRAVENOUS

## 2021-08-07 MED ORDER — CHLORHEXIDINE GLUCONATE 0.12 % MT SOLN
OROMUCOSAL | Status: AC
  Administered 2021-08-07: 15 mL via OROMUCOSAL
  Filled 2021-08-07: qty 15

## 2021-08-07 MED ORDER — PHENYLEPHRINE HCL-NACL 20-0.9 MG/250ML-% IV SOLN
INTRAVENOUS | Status: DC | PRN
  Administered 2021-08-07: 25 ug/min via INTRAVENOUS

## 2021-08-07 MED ORDER — CEFAZOLIN SODIUM-DEXTROSE 2-4 GM/100ML-% IV SOLN
INTRAVENOUS | Status: AC
  Filled 2021-08-07: qty 100

## 2021-08-07 MED ORDER — HYDROMORPHONE HCL 1 MG/ML IJ SOLN
INTRAMUSCULAR | Status: AC
  Filled 2021-08-07: qty 1

## 2021-08-07 MED ORDER — FENTANYL CITRATE (PF) 100 MCG/2ML IJ SOLN
INTRAMUSCULAR | Status: AC
  Filled 2021-08-07: qty 2

## 2021-08-07 MED ORDER — BUPIVACAINE-EPINEPHRINE 0.5% -1:200000 IJ SOLN
INTRAMUSCULAR | Status: DC | PRN
  Administered 2021-08-07: 20 mL

## 2021-08-07 MED ORDER — FENTANYL CITRATE (PF) 100 MCG/2ML IJ SOLN
INTRAMUSCULAR | Status: DC | PRN
  Administered 2021-08-07: 50 ug via INTRAVENOUS

## 2021-08-07 MED ORDER — IBUPROFEN 800 MG PO TABS: 800.0000 mg | ORAL_TABLET | Freq: Three times a day (TID) | ORAL | 0 refills | Status: DC | PRN

## 2021-08-07 MED ORDER — LACTATED RINGERS IV BOLUS
500.0000 mL | INTRAVENOUS | Status: AC
  Administered 2021-08-07: 500 mL via INTRAVENOUS

## 2021-08-07 MED ORDER — CHLORHEXIDINE GLUCONATE CLOTH 2 % EX PADS: 6.0000 | MEDICATED_PAD | Freq: Once | CUTANEOUS | Status: DC

## 2021-08-07 MED ORDER — HYDROCODONE-ACETAMINOPHEN 5-325 MG PO TABS: 1.0000 | ORAL_TABLET | Freq: Four times a day (QID) | ORAL | 0 refills | Status: DC | PRN

## 2021-08-07 MED ORDER — ORAL CARE MOUTH RINSE: 15.0000 mL | Freq: Once | OROMUCOSAL | Status: AC

## 2021-08-07 MED ORDER — ACETAMINOPHEN 500 MG PO TABS
ORAL_TABLET | ORAL | Status: AC
  Administered 2021-08-07: 1000 mg via ORAL
  Filled 2021-08-07: qty 2

## 2021-08-07 MED ORDER — SUCCINYLCHOLINE CHLORIDE 200 MG/10ML IV SOSY
PREFILLED_SYRINGE | INTRAVENOUS | Status: DC | PRN
  Administered 2021-08-07: 100 mg via INTRAVENOUS

## 2021-08-07 MED ORDER — FENTANYL CITRATE (PF) 100 MCG/2ML IJ SOLN: 25.0000 ug | INTRAMUSCULAR | Status: DC | PRN

## 2021-08-07 MED ORDER — MIDAZOLAM HCL 2 MG/2ML IJ SOLN
INTRAMUSCULAR | Status: AC
  Filled 2021-08-07: qty 2

## 2021-08-07 MED ORDER — KETOROLAC TROMETHAMINE 30 MG/ML IJ SOLN
INTRAMUSCULAR | Status: DC | PRN
  Administered 2021-08-07: 30 mg via INTRAVENOUS

## 2021-08-07 MED ORDER — ACETAMINOPHEN 10 MG/ML IV SOLN: 1000.0000 mg | Freq: Once | INTRAVENOUS | Status: DC | PRN

## 2021-08-07 MED ORDER — ACETAMINOPHEN 500 MG PO TABS: 1000.0000 mg | ORAL_TABLET | ORAL | Status: AC

## 2021-08-07 MED ORDER — DEXAMETHASONE SODIUM PHOSPHATE 10 MG/ML IJ SOLN
INTRAMUSCULAR | Status: DC | PRN
  Administered 2021-08-07: 10 mg via INTRAVENOUS

## 2021-08-07 MED ORDER — CEFAZOLIN SODIUM-DEXTROSE 2-4 GM/100ML-% IV SOLN
2.0000 g | INTRAVENOUS | Status: AC
  Administered 2021-08-07: 2 g via INTRAVENOUS

## 2021-08-07 MED ORDER — CHLORHEXIDINE GLUCONATE 0.12 % MT SOLN: 15.0000 mL | Freq: Once | OROMUCOSAL | Status: AC

## 2021-08-07 MED ORDER — PROPOFOL 500 MG/50ML IV EMUL
INTRAVENOUS | Status: AC
  Filled 2021-08-07: qty 50

## 2021-08-07 MED ORDER — 0.9 % SODIUM CHLORIDE (POUR BTL) OPTIME
TOPICAL | Status: DC | PRN
  Administered 2021-08-07: 500 mL

## 2021-08-07 MED ORDER — OXYCODONE HCL 5 MG PO TABS
5.0000 mg | ORAL_TABLET | Freq: Once | ORAL | Status: AC | PRN
  Administered 2021-08-07: 5 mg via ORAL

## 2021-08-07 MED ORDER — BUPIVACAINE-EPINEPHRINE (PF) 0.5% -1:200000 IJ SOLN
INTRAMUSCULAR | Status: DC | PRN
  Administered 2021-08-07: 30 mL

## 2021-08-07 MED ORDER — GLYCOPYRROLATE 0.2 MG/ML IJ SOLN
INTRAMUSCULAR | Status: DC | PRN
  Administered 2021-08-07: .2 mg via INTRAVENOUS

## 2021-08-07 MED ORDER — FAMOTIDINE 20 MG PO TABS: 20.0000 mg | ORAL_TABLET | Freq: Once | ORAL | Status: AC

## 2021-08-07 MED ORDER — LIDOCAINE HCL (CARDIAC) PF 100 MG/5ML IV SOSY
PREFILLED_SYRINGE | INTRAVENOUS | Status: DC | PRN
Start: 2021-08-07 — End: 2021-08-07
  Administered 2021-08-07: 100 mg via INTRAVENOUS

## 2021-08-07 MED ORDER — GABAPENTIN 300 MG PO CAPS: 300.0000 mg | ORAL_CAPSULE | ORAL | Status: AC

## 2021-08-07 MED ORDER — BUPIVACAINE-EPINEPHRINE (PF) 0.5% -1:200000 IJ SOLN
INTRAMUSCULAR | Status: AC
  Filled 2021-08-07: qty 30

## 2021-08-07 MED ORDER — ACETAMINOPHEN 325 MG PO TABS: 650.0000 mg | ORAL_TABLET | Freq: Three times a day (TID) | ORAL | 0 refills | Status: AC | PRN

## 2021-08-07 MED ORDER — DOCUSATE SODIUM 100 MG PO CAPS: 100.0000 mg | ORAL_CAPSULE | Freq: Two times a day (BID) | ORAL | 0 refills | Status: AC | PRN

## 2021-08-07 MED ORDER — PROPOFOL 10 MG/ML IV BOLUS
INTRAVENOUS | Status: DC | PRN
  Administered 2021-08-07: 200 mg via INTRAVENOUS

## 2021-08-07 MED ORDER — ONDANSETRON HCL 4 MG/2ML IJ SOLN
INTRAMUSCULAR | Status: DC | PRN
Start: 2021-08-07 — End: 2021-08-07
  Administered 2021-08-07 (×2): 4 mg via INTRAVENOUS

## 2021-08-07 MED ORDER — FAMOTIDINE 20 MG PO TABS
ORAL_TABLET | ORAL | Status: AC
  Administered 2021-08-07: 20 mg via ORAL
  Filled 2021-08-07: qty 1

## 2021-08-07 MED ORDER — DEXMEDETOMIDINE (PRECEDEX) IN NS 20 MCG/5ML (4 MCG/ML) IV SYRINGE
PREFILLED_SYRINGE | INTRAVENOUS | Status: DC | PRN
  Administered 2021-08-07: 8 ug via INTRAVENOUS
  Administered 2021-08-07: 12 ug via INTRAVENOUS

## 2021-08-07 MED ORDER — MIDAZOLAM HCL 2 MG/2ML IJ SOLN
INTRAMUSCULAR | Status: DC | PRN
Start: 2021-08-07 — End: 2021-08-07
  Administered 2021-08-07: 2 mg via INTRAVENOUS

## 2021-08-07 MED ORDER — BUPIVACAINE LIPOSOME 1.3 % IJ SUSP
INTRAMUSCULAR | Status: AC
  Filled 2021-08-07: qty 20

## 2021-08-07 MED ORDER — GABAPENTIN 300 MG PO CAPS
ORAL_CAPSULE | ORAL | Status: AC
  Administered 2021-08-07: 300 mg via ORAL
  Filled 2021-08-07: qty 1

## 2021-08-07 MED ORDER — LACTATED RINGERS IV SOLN: INTRAVENOUS | Status: DC

## 2021-08-07 MED ORDER — ONDANSETRON HCL 4 MG/2ML IJ SOLN
INTRAMUSCULAR | Status: AC
  Filled 2021-08-07: qty 2

## 2021-08-07 MED ORDER — OXYCODONE HCL 5 MG/5ML PO SOLN
5.0000 mg | Freq: Once | ORAL | Status: AC | PRN
Start: 2021-08-07 — End: 2021-08-07

## 2021-08-07 MED ORDER — ROCURONIUM BROMIDE 100 MG/10ML IV SOLN
INTRAVENOUS | Status: DC | PRN
  Administered 2021-08-07: 40 mg via INTRAVENOUS
  Administered 2021-08-07: 50 mg via INTRAVENOUS
  Administered 2021-08-07: 10 mg via INTRAVENOUS
  Administered 2021-08-07: 20 mg via INTRAVENOUS

## 2021-08-07 MED ORDER — HYDROMORPHONE HCL 1 MG/ML IJ SOLN
INTRAMUSCULAR | Status: DC | PRN
  Administered 2021-08-07: 1 mg via INTRAVENOUS

## 2021-08-07 MED ORDER — OXYCODONE HCL 5 MG PO TABS
ORAL_TABLET | ORAL | Status: AC
  Filled 2021-08-07: qty 1

## 2021-08-07 MED ORDER — SUGAMMADEX SODIUM 500 MG/5ML IV SOLN
INTRAVENOUS | Status: DC | PRN
  Administered 2021-08-07: 450 mg via INTRAVENOUS

## 2021-08-07 SURGICAL SUPPLY — 56 items
ADH SKN CLS APL DERMABOND .7 (GAUZE/BANDAGES/DRESSINGS) ×2
APL PRP STRL LF DISP 70% ISPRP (MISCELLANEOUS) ×2
BAG INFUSER PRESSURE 100CC (MISCELLANEOUS) IMPLANT
BLADE SURG SZ11 CARB STEEL (BLADE) ×3 IMPLANT
BNDG GAUZE ELAST 4 BULKY (GAUZE/BANDAGES/DRESSINGS) ×3 IMPLANT
CHLORAPREP W/TINT 26 (MISCELLANEOUS) ×3 IMPLANT
COVER TIP SHEARS 8 DVNC (MISCELLANEOUS) ×2 IMPLANT
COVER TIP SHEARS 8MM DA VINCI (MISCELLANEOUS) ×1
DEFOGGER SCOPE WARMER CLEARIFY (MISCELLANEOUS) ×3 IMPLANT
DERMABOND ADVANCED (GAUZE/BANDAGES/DRESSINGS) ×1
DERMABOND ADVANCED .7 DNX12 (GAUZE/BANDAGES/DRESSINGS) ×2 IMPLANT
DRAPE ARM DVNC X/XI (DISPOSABLE) ×6 IMPLANT
DRAPE COLUMN DVNC XI (DISPOSABLE) ×2 IMPLANT
DRAPE DA VINCI XI ARM (DISPOSABLE) ×3
DRAPE DA VINCI XI COLUMN (DISPOSABLE) ×1
ELECT CAUTERY BLADE 6.4 (BLADE) IMPLANT
ELECT REM PT RETURN 9FT ADLT (ELECTROSURGICAL) ×3
ELECTRODE REM PT RTRN 9FT ADLT (ELECTROSURGICAL) ×2 IMPLANT
GAUZE 4X4 16PLY ~~LOC~~+RFID DBL (SPONGE) ×3 IMPLANT
GLOVE SURG SYN 6.5 ES PF (GLOVE) ×6 IMPLANT
GLOVE SURG UNDER POLY LF SZ7 (GLOVE) ×6 IMPLANT
GOWN STRL REUS W/ TWL LRG LVL3 (GOWN DISPOSABLE) ×6 IMPLANT
GOWN STRL REUS W/TWL LRG LVL3 (GOWN DISPOSABLE) ×9
IRRIGATOR SUCT 8 DISP DVNC XI (IRRIGATION / IRRIGATOR) IMPLANT
IRRIGATOR SUCTION 8MM XI DISP (IRRIGATION / IRRIGATOR)
IV NS 1000ML (IV SOLUTION)
IV NS 1000ML BAXH (IV SOLUTION) IMPLANT
LABEL OR SOLS (LABEL) IMPLANT
LIGASURE VESSEL 5MM BLUNT TIP (ELECTROSURGICAL) ×3 IMPLANT
MANIFOLD NEPTUNE II (INSTRUMENTS) ×3 IMPLANT
MESH 3DMAX 4X6 LT LRG (Mesh General) ×1 IMPLANT
MESH 3DMAX 4X6 RT LRG (Mesh General) ×1 IMPLANT
MESH 3DMAX MID 4X6 LT LRG (Mesh General) ×2 IMPLANT
MESH 3DMAX MID 4X6 RT LRG (Mesh General) ×2 IMPLANT
NEEDLE HYPO 22GX1.5 SAFETY (NEEDLE) ×3 IMPLANT
NEEDLE INSUFFLATION 14GA 120MM (NEEDLE) ×3 IMPLANT
NS IRRIG 500ML POUR BTL (IV SOLUTION) ×3 IMPLANT
OBTURATOR OPTICAL STANDARD 8MM (TROCAR) ×1
OBTURATOR OPTICAL STND 8 DVNC (TROCAR) ×2
OBTURATOR OPTICALSTD 8 DVNC (TROCAR) ×2 IMPLANT
PACK LAP CHOLECYSTECTOMY (MISCELLANEOUS) ×3 IMPLANT
PENCIL ELECTRO HAND CTR (MISCELLANEOUS) ×3 IMPLANT
SEAL CANN UNIV 5-8 DVNC XI (MISCELLANEOUS) ×6 IMPLANT
SEAL XI 5MM-8MM UNIVERSAL (MISCELLANEOUS) ×3
SET TUBE SMOKE EVAC HIGH FLOW (TUBING) ×3 IMPLANT
SOLUTION ELECTROLUBE (MISCELLANEOUS) ×3 IMPLANT
SUT MNCRL 4-0 (SUTURE) ×6
SUT MNCRL 4-0 27XMFL (SUTURE) ×4
SUT VIC AB 2-0 SH 27 (SUTURE) ×6
SUT VIC AB 2-0 SH 27XBRD (SUTURE) ×4 IMPLANT
SUT VLOC 90 6 CV-15 VIOLET (SUTURE) ×6 IMPLANT
SUTURE MNCRL 4-0 27XMF (SUTURE) ×4 IMPLANT
SYR 30ML LL (SYRINGE) ×3 IMPLANT
TAPE TRANSPORE STRL 2 31045 (GAUZE/BANDAGES/DRESSINGS) ×3 IMPLANT
WAND RF SURG SPNG DETECT SYS (INSTRUMENTS) IMPLANT
WATER STERILE IRR 500ML POUR (IV SOLUTION) ×3 IMPLANT

## 2021-08-07 NOTE — Anesthesia Postprocedure Evaluation (Signed)
Anesthesia Post Note  Patient: Jonathan Herring.  Procedure(s) Performed: XI ROBOTIC ASSISTED BILATERAL INGUINAL HERNIA REPAIR WITH MESH (Bilateral: Inguinal)  Patient location during evaluation: PACU Anesthesia Type: General Level of consciousness: awake and alert Pain management: pain level controlled Vital Signs Assessment: post-procedure vital signs reviewed and stable Respiratory status: spontaneous breathing, nonlabored ventilation, respiratory function stable and patient connected to nasal cannula oxygen Cardiovascular status: blood pressure returned to baseline and stable Postop Assessment: no apparent nausea or vomiting Anesthetic complications: no   No notable events documented.   Last Vitals:  Vitals:   08/07/21 1400 08/07/21 1437  BP: 130/64 118/63  Pulse: (!) 54 70  Resp: 16 15  Temp:  36.4 C  SpO2: 97% 96%    Last Pain:  Vitals:   08/07/21 1437  TempSrc: Oral  PainSc: 0-No pain                 Precious Haws Armya Westerhoff

## 2021-08-07 NOTE — Transfer of Care (Signed)
Immediate Anesthesia Transfer of Care Note  Patient: Jonathan Herring.  Procedure(s) Performed: XI ROBOTIC ASSISTED BILATERAL INGUINAL HERNIA REPAIR WITH MESH (Bilateral: Inguinal)  Patient Location: PACU  Anesthesia Type:General  Level of Consciousness: drowsy and patient cooperative  Airway & Oxygen Therapy: Patient Spontanous Breathing and Patient connected to face mask oxygen  Post-op Assessment: Report given to RN and Post -op Vital signs reviewed and stable  Post vital signs: Reviewed and stable  Last Vitals:  Vitals Value Taken Time  BP 108/61 08/07/21 1049  Temp 36.3 C 08/07/21 1049  Pulse 66 08/07/21 1051  Resp 15 08/07/21 1051  SpO2 100 % 08/07/21 1051  Vitals shown include unvalidated device data.  Last Pain:  Vitals:   08/07/21 1049  TempSrc:   PainSc: Asleep         Complications: No notable events documented.

## 2021-08-07 NOTE — Op Note (Signed)
Preoperative diagnosis: Bilateral reducible inguinal Hernia.  Postoperative diagnosis: Bilateral incarcerated inguinal Hernia recurrent ventral  Procedure: Robotic assisted laparoscopic bilateral inguinal hernia repair with mesh  Anesthesia: General  Surgeon: Dr. Lysle Pearl  Wound Classification: Clean  Specimen: none  Complications: None  Estimated Blood Loss: 47mL   Indications:  inguinal hernia. Repair was indicated to avoid complications of incarceration, obstruction and pain, and a prosthetic mesh repair was elected.  See H&P for further details.  Findings: 1. Vas Deferens and cord structures identified and preserved 2. Bard 3D max medium weight mesh used for repair 3. Adequate hemostasis achieved 4.  Left incarcerated inguinal hernia containing colon and omentum, right inguinal hernia, recurrent ventral hernia  Description of procedure: The patient was taken to the operating room. A time-out was completed verifying correct patient, procedure, site, positioning, and implant(s) and/or special equipment prior to beginning this procedure.  Area was prepped and draped in the usual sterile fashion.  Foley placed.  An incision was marked 20 cm above the pubic tubercle, slightly above the umbilicus.   Veress needle inserted at palmer's point.  Saline drop test noted to be positive with gradual increase in pressure after initiation of gas insufflation.  15 mm of pressure was achieved prior to removing the Veress needle and placing 5 mm Optiview port through the same location to enter the abdominal cavity.  Inspection of the area afterwards noted no injury to the surrounding organs during insertion of the needle and the port.  Inspection of the abdominal cavity noted a recurrent midline ventral hernia with omental contents incarcerated within it blocking the view for proper placement of the periumbilical port site.  2 lateral 8 mm port sites were visible so the ports were placed under direct  visualization.  The incarcerated omentum within the ventral hernia was then reduced with blunt dissection and LigaSure device.  Hemostasis noted during this portion of procedure.  Once all the omental contents were successfully reduced the periumbilical 8 mm port was placed under direct visualization.  Local anesthesia  infused to the preplanned incision sites prior to insertion of the port.  The [place] was then brought into the operative field and docked to the ports.  Repair of the incidental recurrent ventral hernia not possible due to its location not amenable to repair with the port site placement for the originally planned inguinal hernia, and no consent obtained.  Since this hernia was asymptomatic we can consider repair at a later time.  Examination of the groin area noted a incarcerated left and right inguinal hernia.  The left inguinal hernia containing additional omentum and loop of colon.  This had to be meticulously dissected off the hernia sac and reduced gently.  A peritoneal flap was then created on the left side approximately 8cm cephalad to the defect by using scissors with electrocautery.  Dissection was carried down towards the pubic tubercle, developing the myopectineal orifice view.  Laterally the flap was carried towards the ASIS.  Large hernia sac was noted, which carefully dissected away from the adjacent tissues to be fully reduced out of hernia cavity.  Any bleeding was controlled with combination of electrocautery and manual pressure.    After confirming adequate dissection and the peritoneal reflection completely down and away from the cord structures, a Large Bard 3DMax medium weight mesh was placed within the anterior abdominal wall, secured in place using 2-0 Vicryl on an SH needle immediately above the pubic tubercle.  After noting proper placement of  the mesh with the peritoneal reflection deep to it, the previously created peritoneal flap was secured back up to  the anterior abdominal wall using running 3-0 V-Lock.  Both needles were then removed out of the abdominal cavity.  Attention then turned to the right side.  A peritoneal flap was then created on the left side approximately 8cm cephalad to the defect by using scissors with electrocautery.  Dissection was carried down towards the pubic tubercle, developing the myopectineal orifice view.  Laterally the flap was carried towards the ASIS.  Medium hernia sac was noted, which carefully dissected away from the adjacent tissues to be fully reduced out of hernia cavity.  Any bleeding was controlled with combination of electrocautery and manual pressure.    After confirming adequate dissection and the peritoneal reflection completely down and away from the cord structures, a Large Bard 3DMax medium weight mesh was placed within the anterior abdominal wall, secured in place using 2-0 Vicryl on an SH needle immediately above the pubic tubercle.  After noting proper placement of the mesh with the peritoneal reflection deep to it, the previously created peritoneal flap was secured back up to the anterior abdominal wall using running 3-0 V-Lock.  Both needles were then removed out of the abdominal cavity.Xi platform undocked from the ports and removed off of operative field.  exparel infused as ilioinguinal block bilaterally.  Abdomen then desufflated and ports removed. All the skin incisions were then closed with a subcuticular stitch of Monocryl 4-0. Dermabond was applied.  Foley removed.  The testis was gently pulled down into its anatomic position in the scrotum.  The patient tolerated the procedure well and was taken to the postanesthesia care unit in stable condition. Sponge and instrument count correct at end of procedure.

## 2021-08-07 NOTE — Interval H&P Note (Signed)
No change. Ok to proceed

## 2021-08-07 NOTE — Discharge Instructions (Addendum)
Hernia repair, Care After This sheet gives you information about how to care for yourself after your procedure. Your health care provider may also give you more specific instructions. If you have problems or questions, contact your health care provider. What can I expect after the procedure? After your procedure, it is common to have the following: Pain in your abdomen, especially in the incision areas. You will be given medicine to control the pain. Tiredness. This is a normal part of the recovery process. Your energy level will return to normal over the next several weeks. Changes in your bowel movements, such as constipation or needing to go more often. Talk with your health care provider about how to manage this. Follow these instructions at home: Medicines  tylenol and advil as needed for discomfort.  Please alternate between the two every four hours as needed for pain.    Use narcotics, if prescribed, only when tylenol and motrin is not enough to control pain.  325-650mg every 8hrs to max of 3000mg/24hrs (including the 325mg in every norco dose) for the tylenol.    Advil up to 800mg per dose every 8hrs as needed for pain.   PLEASE RECORD NUMBER OF PILLS TAKEN UNTIL NEXT FOLLOW UP APPT.  THIS WILL HELP DETERMINE HOW READY YOU ARE TO BE RELEASED FROM ANY ACTIVITY RESTRICTIONS Do not drive or use heavy machinery while taking prescription pain medicine. Do not drink alcohol while taking prescription pain medicine.  Incision care    Follow instructions from your health care provider about how to take care of your incision areas. Make sure you: Keep your incisions clean and dry. Wash your hands with soap and water before and after applying medicine to the areas, and before and after changing your bandage (dressing). If soap and water are not available, use hand sanitizer. Change your dressing as told by your health care provider. Leave stitches (sutures), skin glue, or adhesive strips in  place. These skin closures may need to stay in place for 2 weeks or longer. If adhesive strip edges start to loosen and curl up, you may trim the loose edges. Do not remove adhesive strips completely unless your health care provider tells you to do that. Do not wear tight clothing over the incisions. Tight clothing may rub and irritate the incision areas, which may cause the incisions to open. Do not take baths, swim, or use a hot tub until your health care provider approves. OK TO SHOWER IN 24HRS.   Check your incision area every day for signs of infection. Check for: More redness, swelling, or pain. More fluid or blood. Warmth. Pus or a bad smell. Activity Avoid lifting anything that is heavier than 10 lb (4.5 kg) for 2 weeks or until your health care provider says it is okay. No pushing/pulling greater than 30lbs You may resume normal activities as told by your health care provider. Ask your health care provider what activities are safe for you. Take rest breaks during the day as needed. Eating and drinking Follow instructions from your health care provider about what you can eat after surgery. To prevent or treat constipation while you are taking prescription pain medicine, your health care provider may recommend that you: Drink enough fluid to keep your urine clear or pale yellow. Take over-the-counter or prescription medicines. Eat foods that are high in fiber, such as fresh fruits and vegetables, whole grains, and beans. Limit foods that are high in fat and processed sugars, such as fried and   sweet foods. General instructions Ask your health care provider when you will need an appointment to get your sutures or staples removed. Keep all follow-up visits as told by your health care provider. This is important. Contact a health care provider if: You have more redness, swelling, or pain around your incisions. You have more fluid or blood coming from the incisions. Your incisions feel  warm to the touch. You have pus or a bad smell coming from your incisions or your dressing. You have a fever. You have an incision that breaks open (edges not staying together) after sutures or staples have been removed. Get help right away if: You develop a rash. You have chest pain or difficulty breathing. You have pain or swelling in your legs. You feel light-headed or you faint. Your abdomen swells (becomes distended). You have nausea or vomiting. You have blood in your stool (feces). This information is not intended to replace advice given to you by your health care provider. Make sure you discuss any questions you have with your health care provider. Document Released: 04/23/2005 Document Revised: 06/23/2018 Document Reviewed: 07/05/2016 Elsevier Interactive Patient Education  2019 [employer]   The drugs that you were given will stay in your system until tomorrow so for the next 24 hours you should not:  Drive an automobile Make any legal decisions Drink any alcoholic beverage   You may resume regular meals tomorrow.  Today it is better to start with liquids and gradually work up to solid foods.  You may eat anything you prefer, but it is better to start with liquids, then soup and crackers, and gradually work up to solid foods.   Please notify your doctor immediately if you have any unusual bleeding, trouble breathing, redness and pain at the surgery site, drainage, fever, or pain not relieved by medication.    Your post-operative visit with Dr.                                       is: Date:                        Time:    Please call to schedule your post-operative visit.  Additional Instructions:  PLEASE KEEP TEAL/GREEN ARMBAND ON FOR FOUR DAYS:  NUMBING MEDICINE    IF YOU DO NOT URINATE BY THE END OF TODAY PLEASE GO TO EMERGENCY ROOM PER DR Lysle Pearl

## 2021-08-07 NOTE — Anesthesia Procedure Notes (Signed)
Procedure Name: Intubation Date/Time: 08/07/2021 7:34 AM Performed by: Kelton Pillar, CRNA Pre-anesthesia Checklist: Patient identified, Emergency Drugs available, Suction available and Patient being monitored Patient Re-evaluated:Patient Re-evaluated prior to induction Oxygen Delivery Method: Circle system utilized Preoxygenation: Pre-oxygenation with 100% oxygen Induction Type: IV induction Ventilation: Mask ventilation without difficulty Laryngoscope Size: McGraph and 3 Grade View: Grade I Tube type: Oral Number of attempts: 1 Airway Equipment and Method: Stylet and Oral airway Placement Confirmation: ETT inserted through vocal cords under direct vision, positive ETCO2, breath sounds checked- equal and bilateral and CO2 detector Secured at: 21 cm Tube secured with: Tape Dental Injury: Teeth and Oropharynx as per pre-operative assessment

## 2021-08-07 NOTE — Progress Notes (Signed)
Pt informed at 1355 that per Dr. Lysle Pearl will give him some more IV fluids and wait until 400 PM and see if he is able to urinate.    1430- called Dr Lysle Pearl and informed him that patient is requesting to go home, he had his mom have been here since 6 AM.  Per Dr Lysle Pearl ok if patient would like to go home but he needs to return to ED if he does not urinate by the end of the day.  21 ML noted on bladder scan at this time.  Pt verbalized understanding. Pt mother also made aware of this.

## 2021-08-07 NOTE — Anesthesia Preprocedure Evaluation (Addendum)
Anesthesia Evaluation  Patient identified by MRN, date of birth, ID band Patient awake    Reviewed: Allergy & Precautions, NPO status , Patient's Chart, lab work & pertinent test results  History of Anesthesia Complications Negative for: history of anesthetic complications  Airway Mallampati: III   Neck ROM: Full    Dental  (+)    Pulmonary neg pulmonary ROS,    Pulmonary exam normal breath sounds clear to auscultation       Cardiovascular Exercise Tolerance: Good Normal cardiovascular exam Rhythm:Regular Rate:Normal  ECG 07/27/21: NSR   Neuro/Psych PSYCHIATRIC DISORDERS Anxiety Vertigo; acoustic neuroma    GI/Hepatic negative GI ROS,   Endo/Other  Prediabetes; obesity  Renal/GU negative Renal ROS     Musculoskeletal   Abdominal   Peds  Hematology negative hematology ROS (+)   Anesthesia Other Findings   Reproductive/Obstetrics                           Anesthesia Physical Anesthesia Plan  ASA: 2  Anesthesia Plan: General   Post-op Pain Management:    Induction: Intravenous  PONV Risk Score and Plan: 2 and Ondansetron, Dexamethasone and Treatment may vary due to age or medical condition  Airway Management Planned: Oral ETT  Additional Equipment:   Intra-op Plan:   Post-operative Plan: Extubation in OR  Informed Consent: I have reviewed the patients History and Physical, chart, labs and discussed the procedure including the risks, benefits and alternatives for the proposed anesthesia with the patient or authorized representative who has indicated his/her understanding and acceptance.     Dental advisory given  Plan Discussed with: CRNA  Anesthesia Plan Comments:         Anesthesia Quick Evaluation

## 2021-08-08 ENCOUNTER — Emergency Department: Payer: 59

## 2021-08-08 ENCOUNTER — Other Ambulatory Visit: Payer: Self-pay

## 2021-08-08 ENCOUNTER — Emergency Department
Admission: EM | Admit: 2021-08-08 | Discharge: 2021-08-08 | Disposition: A | Discharge status: Home / Self Care | Payer: 59 | Attending: Emergency Medicine | Admitting: Emergency Medicine

## 2021-08-08 DIAGNOSIS — E119 Type 2 diabetes mellitus without complications: Secondary | ICD-10-CM | POA: Diagnosis not present

## 2021-08-08 DIAGNOSIS — R110 Nausea: Secondary | ICD-10-CM | POA: Insufficient documentation

## 2021-08-08 LAB — CBC WITH DIFFERENTIAL/PLATELET
Abs Immature Granulocytes: 0.03 10*3/uL (ref 0.00–0.07)
Basophils Absolute: 0 10*3/uL (ref 0.0–0.1)
Basophils Relative: 0 %
Eosinophils Absolute: 0 10*3/uL (ref 0.0–0.5)
Eosinophils Relative: 0 %
HCT: 40.6 % (ref 39.0–52.0)
Hemoglobin: 14.3 g/dL (ref 13.0–17.0)
Immature Granulocytes: 0 %
Lymphocytes Relative: 26 %
Lymphs Abs: 2.8 10*3/uL (ref 0.7–4.0)
MCH: 31.8 pg (ref 26.0–34.0)
MCHC: 35.2 g/dL (ref 30.0–36.0)
MCV: 90.2 fL (ref 80.0–100.0)
Monocytes Absolute: 0.9 10*3/uL (ref 0.1–1.0)
Monocytes Relative: 8 %
Neutro Abs: 7 10*3/uL (ref 1.7–7.7)
Neutrophils Relative %: 66 %
Platelets: 219 10*3/uL (ref 150–400)
RBC: 4.5 MIL/uL (ref 4.22–5.81)
RDW: 13.2 % (ref 11.5–15.5)
WBC: 10.7 10*3/uL — ABNORMAL HIGH (ref 4.0–10.5)
nRBC: 0 % (ref 0.0–0.2)

## 2021-08-08 LAB — BASIC METABOLIC PANEL
Anion gap: 10 (ref 5–15)
BUN: 18 mg/dL (ref 6–20)
CO2: 27 mmol/L (ref 22–32)
Calcium: 8.9 mg/dL (ref 8.9–10.3)
Chloride: 101 mmol/L (ref 98–111)
Creatinine, Ser: 0.79 mg/dL (ref 0.61–1.24)
GFR, Estimated: >60 mL/min (ref >60)
Glucose, Bld: 113 mg/dL — ABNORMAL HIGH (ref 70–99)
Potassium: 4.1 mmol/L (ref 3.5–5.1)
Sodium: 138 mmol/L (ref 135–145)

## 2021-08-08 MED ORDER — ONDANSETRON HCL 4 MG/2ML IJ SOLN
4.0000 mg | Freq: Once | INTRAMUSCULAR | Status: AC
  Administered 2021-08-08: 4 mg via INTRAVENOUS

## 2021-08-08 MED ORDER — ONDANSETRON 4 MG PO TBDP: 4.0000 mg | ORAL_TABLET | Freq: Once | ORAL | Status: DC

## 2021-08-08 NOTE — ED Notes (Signed)
Dc ppw provided. Bloodwork reviewed, pt iv removed. Pt assisted off floor on foot

## 2021-08-08 NOTE — ED Triage Notes (Signed)
Pt comes pov after hernia surgery yesterday. States bloating and nausea after surgery. A generalized soreness across abd. Pt states he has not picked up his post op meds yet. Has been urinating and had a BM since.

## 2021-08-08 NOTE — ED Provider Notes (Signed)
Emergency Medicine Provider Triage Evaluation Note  Jonathan Herring. , a 55 y.o. male  was evaluated in triage.  Pt complains of nausea and bloating after hernia repair yesterday. Pain 2/10 but hasn't taken narcotic pain medications. Urinating and has had bowel movement since discharge.  Review of Systems  Positive: Abdominal pain Negative: Fever  Physical Exam  There were no vitals taken for this visit. Gen:   Awake, no distress   Resp:  Normal effort  MSK:   Moves extremities without difficulty  Other:    Medical Decision Making  Medically screening exam initiated at 11:51 AM.  Appropriate orders placed.  Jonathan Herring. was informed that the remainder of the evaluation will be completed by another provider, this initial triage assessment does not replace that evaluation, and the importance of remaining in the ED until their evaluation is complete.   Victorino Dike, FNP 08/08/21 1214    Naaman Plummer, MD 08/08/21 [phone]

## 2021-08-08 NOTE — ED Provider Notes (Signed)
[facility] Emergency Department Provider Note  ____________________________________________   Event Date/Time   First MD Initiated Contact with Patient 08/08/21 1222     (approximate)  I have reviewed the triage vital signs and the nursing notes.   HISTORY  Chief Complaint Nausea    HPI Jonathan Schuyler. is a 55 y.o. male presents emergency department complaining of abdominal bloating and pain.  Some nausea.  Pain is rated at 2/10.  Patient has not taken his narcotic pain medication as he has not picked it up from the drugstore yet.  He is still able to urinate and have a bowel movement.  Denies fever or chills.  Is having some nausea but no vomiting.  Surgery was performed yesterday by Dr. 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 Authorizing Provider  acetaminophen (TYLENOL) 325 MG tablet Take 2 tablets (650 mg total) by mouth every 8 (eight) hours as needed for mild pain. 08/07/21 09/06/21  Lysle Pearl, Isami, DO  Apoaequorin (PREVAGEN PO) Take 1 capsule by mouth daily.    [provider]  docusate sodium (COLACE) 100  MG capsule Take 1 capsule (100 mg total) by mouth 2 (two) times daily as needed for up to 10 days for mild constipation. 08/07/21 08/17/21  Benjamine Sprague, DO  HYDROcodone-acetaminophen (NORCO) 5-325 MG tablet Take 1 tablet by mouth every 6 (six) hours as needed for up to 5 doses for moderate pain. 08/07/21   Lysle Pearl, Isami, DO  ibuprofen (ADVIL) 800 MG tablet Take 1 tablet (800 mg total) by mouth every 8 (eight) hours as needed for mild pain or moderate pain. 08/07/21   Benjamine Sprague, DO  Multiple Vitamin (MULTIVITAMIN WITH MINERALS) TABS tablet Take 1 tablet by mouth daily.    [provider]  polyethylene glycol (MIRALAX / GLYCOLAX) 17 g packet Take 17 g by mouth daily. Mix one tablespoon with 8oz of your favorite juice or water every day until you are having soft formed stools. Then start taking once daily if you didn't have a stool the day before. Patient taking differently: Take 17 g by mouth daily as needed for moderate constipation. Mix one tablespoon with 8oz of your favorite juice or water every day until you are having soft formed stools. Then start taking once daily if you didn't have a stool the day before. 07/27/21   Merlyn Lot, MD    Allergies Patient has no known allergies.  History reviewed. No pertinent family history.  Social History Social History   Tobacco Use   Smoking status: Never   Smokeless tobacco: Never  Vaping Use   Vaping  Use: Never used  Substance Use Topics   Alcohol use: No   Drug use: No    Review of Systems  Constitutional: No fever/chills Eyes: No visual changes. ENT: No sore throat. Respiratory: Denies cough Cardiovascular: Denies chest pain Gastrointestinal: Positive abdominal pain Genitourinary: Negative for dysuria. Musculoskeletal: Negative for back pain. Skin: Negative for rash. Psychiatric: no mood changes,     ____________________________________________   PHYSICAL EXAM:  VITAL SIGNS: ED Triage Vitals [08/08/21  1154]  Enc Vitals Group     BP (!) 149/91     Pulse Rate 76     Resp 18     Temp 98 F (36.7 C)     Temp Source Oral     SpO2 100 %     Weight 229 lb 4.5 oz (104 kg)     Height 5\' 10"  (1.778 m)     Head Circumference      Peak Flow      Pain Score 2     Pain Loc      Pain Edu?      Excl. in [place]?     Constitutional: Alert and oriented. Well appearing and in no acute distress. Eyes: Conjunctivae are normal.  Head: Atraumatic. Nose: No congestion/rhinnorhea. Mouth/Throat: Mucous membranes are moist.   Neck:  supple no lymphadenopathy noted Cardiovascular: Normal rate, regular rhythm. Heart sounds are normal Respiratory: Normal respiratory effort.  No retractions, lungs c t a  Abd: soft minimally tender bs normal all 4 quad GU: deferred Musculoskeletal: FROM all extremities, warm and well perfused Neurologic:  Normal speech and language.  Skin:  Skin is warm, dry and intact. No rash noted. Psychiatric: Mood and affect are normal. Speech and behavior are normal.  ____________________________________________   LABS (all labs ordered are listed, but only abnormal results are displayed)  Labs Reviewed  BASIC METABOLIC PANEL - Abnormal; Notable for the following components:      Result Value   Glucose, Bld 113 (*)    All other components within normal limits  CBC WITH DIFFERENTIAL/PLATELET - Abnormal; Notable for the following components:   WBC 10.7 (*)    All other components within normal limits   ____________________________________________   ____________________________________________  RADIOLOGY  Abdomen 1 view, chest x-ray  ____________________________________________   PROCEDURES  Procedure(s) performed: No  Procedures    ____________________________________________   INITIAL IMPRESSION / ASSESSMENT AND PLAN / ED COURSE  Pertinent labs & imaging results that were available during my care of the patient were reviewed by me and considered in my  medical decision making (see chart for details).   Patient is a 55 year old male presents emergency department with abdominal bloating and pain.  See HPI.  Physical exam shows patient per stable  Abdomen 1 view shows subcu air Chest x-ray confirms subcu air  Consult to Dr. Lysle Pearl, states he will come to the ER to see the patient as this was this patient yesterday.  We are to order CBC and metabolic panel.  CBC and metabolic panel are normal.  Other sick I had instructed me to discharge patient if his labs are normal.  Labs for CBC and metabolic panel are both normal.  Patient is discharged stable condition with instructions to follow-up with Dr. Lysle Pearl.  Patient is in agreement treatment plan.  Jonathan Lex. was evaluated in Emergency Department on 08/08/2021 for the symptoms described in the history of present illness. He was evaluated in the context of the global COVID-19 pandemic, which necessitated  consideration that the patient might be at risk for infection with the SARS-CoV-2 virus that causes COVID-19. Institutional protocols and algorithms that pertain to the evaluation of patients at risk for COVID-19 are in a state of rapid change based on information released by regulatory bodies including the CDC and federal and state organizations. These policies and algorithms were followed during the patient's care in the ED.    As part of my medical decision making, I reviewed the following data within the [place] notes reviewed and incorporated, Labs reviewed , Old chart reviewed, A consult was requested and obtained from this/these consultant(s) Surgery, Notes from prior ED visits, and [place] Controlled Substance Database  ____________________________________________   FINAL CLINICAL IMPRESSION(S) / ED DIAGNOSES  Final diagnoses:  Nausea      NEW MEDICATIONS STARTED DURING THIS VISIT:  New Prescriptions   No medications on file     Note:  This document  was prepared using Dragon voice recognition software and may include unintentional dictation errors.    Versie Starks, PA-C 08/08/21 1434    Nena Polio, MD 08/08/21 [phone]

## 2021-08-08 NOTE — Discharge Instructions (Signed)
Take your pain medication as needed Return to the ER if worsening Call your doctor if unsure of symptoms

## 2021-08-10 ENCOUNTER — Encounter: Payer: Self-pay | Admitting: Surgery

## 2021-09-21 ENCOUNTER — Other Ambulatory Visit: Payer: Self-pay | Admitting: Internal Medicine

## 2021-09-21 ENCOUNTER — Other Ambulatory Visit (HOSPITAL_COMMUNITY): Payer: Self-pay | Admitting: Internal Medicine

## 2021-09-21 DIAGNOSIS — D333 Benign neoplasm of cranial nerves: Secondary | ICD-10-CM

## 2021-10-29 DIAGNOSIS — E119 Type 2 diabetes mellitus without complications: Secondary | ICD-10-CM | POA: Diagnosis not present

## 2021-10-29 DIAGNOSIS — S81801A Unspecified open wound, right lower leg, initial encounter: Secondary | ICD-10-CM | POA: Diagnosis not present

## 2021-10-29 DIAGNOSIS — D333 Benign neoplasm of cranial nerves: Secondary | ICD-10-CM | POA: Diagnosis not present

## 2023-07-18 ENCOUNTER — Other Ambulatory Visit: Payer: Self-pay | Admitting: Orthopedic Surgery

## 2023-07-18 DIAGNOSIS — M25511 Pain in right shoulder: Secondary | ICD-10-CM

## 2023-07-18 DIAGNOSIS — S46011A Strain of muscle(s) and tendon(s) of the rotator cuff of right shoulder, initial encounter: Secondary | ICD-10-CM

## 2023-07-18 DIAGNOSIS — S42251P Displaced fracture of greater tuberosity of right humerus, subsequent encounter for fracture with malunion: Secondary | ICD-10-CM

## 2023-07-26 LAB — EXTERNAL GENERIC LAB PROCEDURE: COLOGUARD: NEGATIVE

## 2023-07-26 LAB — COLOGUARD: COLOGUARD: NEGATIVE

## 2023-07-29 ENCOUNTER — Encounter: Payer: Self-pay | Admitting: Orthopedic Surgery

## 2023-08-01 ENCOUNTER — Encounter: Payer: Self-pay | Admitting: Orthopedic Surgery

## 2023-08-01 ENCOUNTER — Ambulatory Visit (INDEPENDENT_AMBULATORY_CARE_PROVIDER_SITE_OTHER): Payer: 59 | Admitting: Dermatology

## 2023-08-01 VITALS — BP 139/86 | HR 75

## 2023-08-01 DIAGNOSIS — W908XXA Exposure to other nonionizing radiation, initial encounter: Secondary | ICD-10-CM

## 2023-08-01 DIAGNOSIS — L814 Other melanin hyperpigmentation: Secondary | ICD-10-CM | POA: Diagnosis not present

## 2023-08-01 DIAGNOSIS — L570 Actinic keratosis: Secondary | ICD-10-CM

## 2023-08-01 DIAGNOSIS — I872 Venous insufficiency (chronic) (peripheral): Secondary | ICD-10-CM

## 2023-08-01 DIAGNOSIS — L578 Other skin changes due to chronic exposure to nonionizing radiation: Secondary | ICD-10-CM | POA: Diagnosis not present

## 2023-08-01 DIAGNOSIS — I8393 Asymptomatic varicose veins of bilateral lower extremities: Secondary | ICD-10-CM

## 2023-08-01 DIAGNOSIS — B353 Tinea pedis: Secondary | ICD-10-CM

## 2023-08-01 DIAGNOSIS — Z1283 Encounter for screening for malignant neoplasm of skin: Secondary | ICD-10-CM | POA: Diagnosis not present

## 2023-08-01 DIAGNOSIS — Z79899 Other long term (current) drug therapy: Secondary | ICD-10-CM

## 2023-08-01 DIAGNOSIS — I781 Nevus, non-neoplastic: Secondary | ICD-10-CM

## 2023-08-01 DIAGNOSIS — D229 Melanocytic nevi, unspecified: Secondary | ICD-10-CM

## 2023-08-01 DIAGNOSIS — Z7189 Other specified counseling: Secondary | ICD-10-CM

## 2023-08-01 DIAGNOSIS — D1801 Hemangioma of skin and subcutaneous tissue: Secondary | ICD-10-CM

## 2023-08-01 DIAGNOSIS — L821 Other seborrheic keratosis: Secondary | ICD-10-CM

## 2023-08-01 MED ORDER — KETOCONAZOLE 2 % EX CREA: TOPICAL_CREAM | CUTANEOUS | 11 refills | Status: DC

## 2023-08-01 NOTE — Patient Instructions (Signed)
Due to recent changes in healthcare laws, you may see results of your pathology and/or laboratory studies on MyChart before the doctors have had a chance to review them. We understand that in some cases there may be results that are confusing or concerning to you. Please understand that not all results are received at the same time and often the doctors may need to interpret multiple results in order to provide you with the best plan of care or course of treatment. Therefore, we ask that you please give [place] 2 business days to thoroughly review all your results before contacting the office for clarification. Should we see a critical lab result, you will be contacted sooner.   If You Need Anything After Your Visit  If you have any questions or concerns for your doctor, please call our main line at [phone] and press option 4 to reach your doctor's medical assistant. If no one answers, please leave a voicemail as directed and we will return your call as soon as possible. Messages left after 4 pm will be answered the following business day.   You may also send [place] a message via MyChart. We typically respond to MyChart messages within 1-2 business days.  For prescription refills, please ask your pharmacy to contact our office. Our fax number is [phone].  If you have an urgent issue when the clinic is closed that cannot wait until the next business day, you can page your doctor at the number below.    Please note that while we do our best to be available for urgent issues outside of office hours, we are not available 24/7.   If you have an urgent issue and are unable to reach [place], you may choose to seek medical care at your doctor's office, retail clinic, urgent care center, or emergency room.  If you have a medical emergency, please immediately call 911 or go to the emergency department.  Pager Numbers  - Dr. Gwen Pounds: [phone]  - Dr. Roseanne Reno: [phone]  - Dr. Katrinka Blazing: [phone]    In the event of inclement weather, please call our main line at [phone] for an update on the status of any delays or closures.  Dermatology Medication Tips: Please keep the boxes that topical medications come in in order to help keep track of the instructions about where and how to use these. Pharmacies typically print the medication instructions only on the boxes and not directly on the medication tubes.   If your medication is too expensive, please contact our office at [phone] option 4 or send [place] a message through MyChart.   We are unable to tell what your co-pay for medications will be in advance as this is different depending on your insurance coverage. However, we may be able to find a substitute medication at lower cost or fill out paperwork to get insurance to cover a needed medication.   If a prior authorization is required to get your medication covered by your insurance company, please allow [place] 1-2 business days to complete this process.  Drug prices often vary depending on where the prescription is filled and some pharmacies may offer cheaper prices.  The website www.goodrx.com contains coupons for medications through different pharmacies. The prices here do not account for what the cost may be with help from insurance (it may be cheaper with your insurance), but the website can give you the price if you did not use any insurance.  - You can print the associated coupon and take it  with your prescription to the pharmacy.  - You may also stop by our office during regular business hours and pick up a GoodRx coupon card.  - If you need your prescription sent electronically to a different pharmacy, notify our office through [facility] [place] or by phone at [phone] option 4.     Si Usted Necesita Algo Despus de Su Visita  Tambin puede enviarnos un mensaje a travs de Clinical cytogeneticist. Por lo general respondemos a los mensajes de MyChart en el transcurso de 1 a 2 das  hbiles.  Para renovar recetas, por favor pida a su farmacia que se ponga en contacto con nuestra oficina. Annie Sable de fax es [place] [phone].  Si tiene un asunto urgente cuando la clnica est cerrada y que no puede esperar hasta el siguiente da hbil, puede llamar/localizar a su doctor(a) al nmero que aparece a continuacin.   Por favor, tenga en cuenta que aunque hacemos todo lo posible para estar disponibles para asuntos urgentes fuera del horario de [place], no estamos disponibles las 24 horas del da, los 7 [address]  [address] de la [place].   Si tiene un problema urgente y no puede comunicarse con nosotros, puede optar por buscar atencin mdica  en el consultorio de su doctor(a), en una clnica privada, en un centro de atencin urgente o en una sala de emergencias.  Si tiene Engineer, drilling, por favor llame inmediatamente al 911 o vaya a la sala de emergencias.  Nmeros de bper  - Dr. Gwen Pounds: [phone]  - Dra. Roseanne Reno: [phone]  - Dr. Katrinka Blazing: [phone]   En caso de inclemencias del tiempo, por favor llame a Lacy Duverney principal al [phone] para una actualizacin sobre el [place] de cualquier retraso o cierre.  Consejos para la medicacin en dermatologa: Por favor, guarde las cajas en las que vienen los medicamentos de uso tpico para ayudarle a seguir las instrucciones sobre dnde y cmo usarlos. Las farmacias generalmente imprimen las instrucciones del medicamento slo en las cajas y no directamente en los tubos del [place].   Si su medicamento es muy caro, por favor, pngase en contacto con Rolm Gala llamando al [phone] y presione la opcin 4 o envenos un mensaje a travs de Clinical cytogeneticist.   No podemos decirle cul ser su copago por los medicamentos por adelantado ya que esto es diferente dependiendo de la cobertura de su seguro. Sin embargo, es posible que podamos encontrar un medicamento sustituto a Audiological scientist un formulario para que el  seguro cubra el medicamento que se considera necesario.   Si se requiere una autorizacin previa para que su compaa de seguros [place] su medicamento, por favor permtanos de 1 a 2 das hbiles para completar [address]  Los precios de los medicamentos varan con frecuencia dependiendo del Environmental consultant de dnde se surte la receta y alguna farmacias pueden ofrecer precios ms baratos.  El sitio web www.goodrx.com tiene cupones para medicamentos de Health and safety inspector. Los precios aqu no tienen en cuenta lo que podra costar con la ayuda del seguro (puede ser ms barato con su seguro), pero el sitio web puede darle el precio si no utiliz Tourist information centre manager.  - Puede imprimir el cupn correspondiente y llevarlo con su receta a la farmacia.  - Tambin puede pasar por nuestra oficina durante el horario de atencin regular y Education officer, museum una tarjeta de cupones de GoodRx.  - Si necesita que su receta se enve electrnicamente a Psychiatrist, informe a nuestra oficina a travs de MyChart de  [place] o por telfono llamando al [phone] y presione la opcin 4.

## 2023-08-01 NOTE — Progress Notes (Signed)
New Patient Visit   Subjective  Jonathan Herring. is a 57 y.o. male who presents for the following: Skin Cancer Screening and Full Body Skin Exam  The patient presents for Total-Body Skin Exam (TBSE) for skin cancer screening and mole check. The patient has spots, moles and lesions to be evaluated, some may be new or changing and the patient may have concern these could be cancer.  The following portions of the chart were reviewed this encounter and updated as appropriate: medications, allergies, medical history  Review of Systems:  No other skin or systemic complaints except as noted in HPI or Assessment and Plan.  Objective  Well appearing patient in no apparent distress; mood and affect are within normal limits.  A full examination was performed including scalp, head, eyes, ears, nose, lips, neck, chest, axillae, abdomen, back, buttocks, bilateral upper extremities, bilateral lower extremities, hands, feet, fingers, toes, fingernails, and toenails. All findings within normal limits unless otherwise noted below.   Relevant physical exam findings are noted in the Assessment and Plan.  Face and scalp x 24 (24) Erythematous thin papules/macules with gritty scale.     Assessment & Plan   SKIN CANCER SCREENING PERFORMED TODAY.  ACTINIC DAMAGE - Chronic condition, secondary to cumulative UV/sun exposure - diffuse scaly erythematous macules with underlying dyspigmentation - Recommend daily broad spectrum sunscreen SPF 30+ to sun-exposed areas, reapply every 2 hours as needed.  - Staying in the shade or wearing long sleeves, sun glasses (UVA+UVB protection) and wide brim hats (4-inch brim around the entire circumference of the hat) are also recommended for sun protection.  - Call for new or changing lesions.  LENTIGINES, SEBORRHEIC KERATOSES, HEMANGIOMAS - Benign normal skin lesions - Benign-appearing - Call for any changes  MELANOCYTIC NEVI - Tan-brown and/or pink-flesh-colored  symmetric macules and papules - Benign appearing on exam today - Observation - Call clinic for new or changing moles - Recommend daily use of broad spectrum spf 30+ sunscreen to sun-exposed areas.   AK (actinic keratosis) (24) Face and scalp x 24  Actinic keratoses are precancerous spots that appear secondary to cumulative UV radiation exposure/sun exposure over time. They are chronic with expected duration over 1 year. A portion of actinic keratoses will progress to squamous cell carcinoma of the skin. It is not possible to reliably predict which spots will progress to skin cancer and so treatment is recommended to prevent development of skin cancer.  Recommend daily broad spectrum sunscreen SPF 30+ to sun-exposed areas, reapply every 2 hours as needed.  Recommend staying in the shade or wearing long sleeves, sun glasses (UVA+UVB protection) and wide brim hats (4-inch brim around the entire circumference of the hat). Call for new or changing lesions.   Destruction of lesion - Face and scalp x 24 (24) Complexity: simple   Destruction method: cryotherapy   Informed consent: discussed and consent obtained   Timeout:  patient name, date of birth, surgical site, and procedure verified Lesion destroyed using liquid nitrogen: Yes   Region frozen until ice ball extended beyond lesion: Yes   Outcome: patient tolerated procedure well with no complications   Post-procedure details: wound care instructions given    STASIS DERMATITIS Exam: Erythematous, scaly patches involving the ankle and distal lower leg with associated lower leg edema.  Chronic and persistent condition with duration or expected duration over one year. Condition is symptomatic/ bothersome to patient. Not currently at goal.  Stasis in the legs causes chronic leg swelling, which  may result in itchy or painful rashes, skin discoloration, skin texture changes, and sometimes ulceration.  Recommend daily graduated compression  hose/stockings- easiest to put on first thing in morning, remove at bedtime.  Elevate legs as much as possible. Avoid salt/sodium rich foods.  Treatment Plan: No tx needed.  TINEA PEDIS Exam: Scaling and maceration web spaces and over distal and lateral soles. Chronic and persistent condition with duration or expected duration over one year. Condition is symptomatic / bothersome to patient. Not to goal.  Treatment Plan: Start Ketoconazole 2% cream QHS.   Varicose Veins/Spider Veins - Dilated blue, purple or red veins at the lower extremities - Reassured - Smaller vessels can be treated by sclerotherapy (a procedure to inject a medicine into the veins to make them disappear) if desired, but the treatment is not covered by insurance. Larger vessels may be covered if symptomatic and we would refer to vascular surgeon if treatment desired.  Return in about 4 months (around 12/02/2023) for AK follow up.  Maylene Roes, CMA, am acting as scribe for Armida Sans, MD .   Documentation: I have reviewed the above documentation for accuracy and completeness, and I agree with the above.  Armida Sans, MD

## 2023-08-02 ENCOUNTER — Encounter: Payer: Self-pay | Admitting: Orthopedic Surgery

## 2023-08-03 ENCOUNTER — Encounter: Payer: Self-pay | Admitting: Orthopedic Surgery

## 2023-08-04 ENCOUNTER — Encounter: Payer: Self-pay | Admitting: Orthopedic Surgery

## 2023-08-05 ENCOUNTER — Encounter: Payer: Self-pay | Admitting: Orthopedic Surgery

## 2023-08-05 ENCOUNTER — Encounter: Payer: Self-pay | Admitting: Dermatology

## 2023-08-06 ENCOUNTER — Ambulatory Visit
Admission: RE | Admit: 2023-08-06 | Discharge: 2023-08-06 | Disposition: A | Discharge status: Home / Self Care | Payer: 59 | Source: Ambulatory Visit | Attending: Orthopedic Surgery | Admitting: Orthopedic Surgery

## 2023-08-06 DIAGNOSIS — S42251P Displaced fracture of greater tuberosity of right humerus, subsequent encounter for fracture with malunion: Secondary | ICD-10-CM

## 2023-08-06 DIAGNOSIS — M25511 Pain in right shoulder: Secondary | ICD-10-CM

## 2023-08-06 DIAGNOSIS — S46011A Strain of muscle(s) and tendon(s) of the rotator cuff of right shoulder, initial encounter: Secondary | ICD-10-CM

## 2023-08-29 DIAGNOSIS — M12811 Other specific arthropathies, not elsewhere classified, right shoulder: Secondary | ICD-10-CM | POA: Insufficient documentation

## 2023-08-31 ENCOUNTER — Encounter: Payer: Self-pay | Admitting: Family Medicine

## 2023-08-31 ENCOUNTER — Other Ambulatory Visit: Payer: Self-pay | Admitting: Family Medicine

## 2023-08-31 DIAGNOSIS — M5416 Radiculopathy, lumbar region: Secondary | ICD-10-CM

## 2023-09-09 ENCOUNTER — Other Ambulatory Visit: Payer: 59

## 2023-09-23 ENCOUNTER — Encounter: Payer: Self-pay | Admitting: Family Medicine

## 2023-10-01 ENCOUNTER — Ambulatory Visit
Admission: RE | Admit: 2023-10-01 | Discharge: 2023-10-01 | Disposition: A | Discharge status: Home / Self Care | Payer: 59 | Source: Ambulatory Visit | Attending: Family Medicine | Admitting: Family Medicine

## 2023-10-01 DIAGNOSIS — M5416 Radiculopathy, lumbar region: Secondary | ICD-10-CM

## 2023-11-01 ENCOUNTER — Other Ambulatory Visit: Payer: Self-pay | Admitting: Surgery

## 2023-11-04 ENCOUNTER — Other Ambulatory Visit: Payer: Self-pay

## 2023-11-04 ENCOUNTER — Encounter
Admission: RE | Admit: 2023-11-04 | Discharge: 2023-11-04 | Disposition: A | Discharge status: Home / Self Care | Payer: 59 | Source: Ambulatory Visit | Attending: Surgery | Admitting: Surgery

## 2023-11-04 VITALS — BP 129/74 | HR 69 | Resp 16 | Ht 71.0 in | Wt 272.0 lb

## 2023-11-04 DIAGNOSIS — E118 Type 2 diabetes mellitus with unspecified complications: Secondary | ICD-10-CM

## 2023-11-04 DIAGNOSIS — Z01818 Encounter for other preprocedural examination: Secondary | ICD-10-CM | POA: Insufficient documentation

## 2023-11-04 DIAGNOSIS — Z01810 Encounter for preprocedural cardiovascular examination: Secondary | ICD-10-CM | POA: Diagnosis not present

## 2023-11-04 DIAGNOSIS — E119 Type 2 diabetes mellitus without complications: Secondary | ICD-10-CM | POA: Diagnosis not present

## 2023-11-04 HISTORY — DX: Benign neoplasm of cranial nerves: D33.3

## 2023-11-04 LAB — CBC WITH DIFFERENTIAL/PLATELET
Abs Immature Granulocytes: 0.02 10*3/uL (ref 0.00–0.07)
Basophils Absolute: 0 10*3/uL (ref 0.0–0.1)
Basophils Relative: 1 %
Eosinophils Absolute: 0.1 10*3/uL (ref 0.0–0.5)
Eosinophils Relative: 2 %
HCT: 45.6 % (ref 39.0–52.0)
Hemoglobin: 15.5 g/dL (ref 13.0–17.0)
Immature Granulocytes: 0 %
Lymphocytes Relative: 42 %
Lymphs Abs: 2.5 10*3/uL (ref 0.7–4.0)
MCH: 30.2 pg (ref 26.0–34.0)
MCHC: 34 g/dL (ref 30.0–36.0)
MCV: 88.9 fL (ref 80.0–100.0)
Monocytes Absolute: 0.6 10*3/uL (ref 0.1–1.0)
Monocytes Relative: 9 %
Neutro Abs: 2.7 10*3/uL (ref 1.7–7.7)
Neutrophils Relative %: 46 %
Platelets: 276 10*3/uL (ref 150–400)
RBC: 5.13 MIL/uL (ref 4.22–5.81)
RDW: 12.8 % (ref 11.5–15.5)
WBC: 6 10*3/uL (ref 4.0–10.5)
nRBC: 0 % (ref 0.0–0.2)

## 2023-11-04 LAB — SURGICAL PCR SCREEN
MRSA, PCR: NEGATIVE
Staphylococcus aureus: POSITIVE — AB

## 2023-11-04 LAB — URINALYSIS, ROUTINE W REFLEX MICROSCOPIC
Bacteria, UA: NONE SEEN
Bilirubin Urine: NEGATIVE
Glucose, UA: NEGATIVE mg/dL
Ketones, ur: NEGATIVE mg/dL
Leukocytes,Ua: NEGATIVE
Nitrite: NEGATIVE
Protein, ur: NEGATIVE mg/dL
Specific Gravity, Urine: 1.021 (ref 1.005–1.030)
Squamous Epithelial / HPF: 0 /[HPF] (ref 0–5)
pH: 5 (ref 5.0–8.0)

## 2023-11-04 LAB — COMPREHENSIVE METABOLIC PANEL
ALT: 29 U/L (ref 0–44)
AST: 16 U/L (ref 15–41)
Albumin: 3.8 g/dL (ref 3.5–5.0)
Alkaline Phosphatase: 50 U/L (ref 38–126)
Anion gap: 10 (ref 5–15)
BUN: 24 mg/dL — ABNORMAL HIGH (ref 6–20)
CO2: 27 mmol/L (ref 22–32)
Calcium: 8.8 mg/dL — ABNORMAL LOW (ref 8.9–10.3)
Chloride: 101 mmol/L (ref 98–111)
Creatinine, Ser: 0.91 mg/dL (ref 0.61–1.24)
GFR, Estimated: >60 mL/min (ref >60)
Glucose, Bld: 184 mg/dL — ABNORMAL HIGH (ref 70–99)
Potassium: 4.1 mmol/L (ref 3.5–5.1)
Sodium: 138 mmol/L (ref 135–145)
Total Bilirubin: 0.8 mg/dL (ref 0.0–1.2)
Total Protein: 7.3 g/dL (ref 6.5–8.1)

## 2023-11-04 LAB — HEMOGLOBIN A1C
Hgb A1c MFr Bld: 6.8 % — ABNORMAL HIGH (ref 4.8–5.6)
Mean Plasma Glucose: 148.46 mg/dL

## 2023-11-04 NOTE — Patient Instructions (Signed)
Your procedure is scheduled on: Thursday 11/10/23 To find out your arrival time, please call [phone] between 1PM - 3PM on:  Wednesday 11/09/23  Report to the Registration Desk on the 1st floor of the [facility]. FREE Valet parking is available.  If your arrival time is 6:00 am, do not arrive before that time as the [facility] entrance doors do not open until 6:00 am.  REMEMBER: Instructions that are not followed completely may result in serious medical risk, up to and including death; or upon the discretion of your surgeon and anesthesiologist your surgery may need to be rescheduled.  Do not eat food after midnight the night before surgery.  No gum chewing or hard candies.  You may however, drink CLEAR liquids up to 2 hours before you are scheduled to arrive for your surgery. Do not drink anything within 2 hours of your scheduled arrival time.  Clear liquids include: - water   Type 1 and Type 2 diabetics should only drink water.  In addition, your doctor has ordered for you to drink the provided:  Gatorade G2 Drinking this carbohydrate drink up to two hours before surgery helps to reduce insulin resistance and improve patient outcomes. Please complete drinking 2 hours before scheduled arrival time.  One week prior to surgery: Stop Anti-inflammatories (NSAIDS) such as Advil, Aleve, Ibuprofen, Motrin, Naproxen, Naprosyn and Aspirin based products such as Excedrin, Goody's Powder, BC Powder. You may however, continue to take Tylenol if needed for pain up until the day of surgery.  Stop ANY OVER THE COUNTER supplements and vitamins for 7 days until after surgery.  Continue taking all prescribed medications with the exception of the following: Metformin, hold for 2 days with last dose Monday night 11/07/23  TAKE ONLY THESE MEDICATIONS THE MORNING OF SURGERY WITH A SIP OF WATER:  none  No Alcohol for 24 hours before or after surgery.  No Smoking including e-cigarettes for 24  hours before surgery.  No chewable tobacco products for at least 6 hours before surgery.  No nicotine patches on the day of surgery.  Do not use any "recreational" drugs for at least a week (preferably 2 weeks) before your surgery.  Please be advised that the combination of cocaine and anesthesia may have negative outcomes, up to and including death. If you test positive for cocaine, your surgery will be cancelled.  On the morning of surgery brush your teeth with toothpaste and water, you may rinse your mouth with mouthwash if you wish. Do not swallow any toothpaste or mouthwash.  Use CHG Soap or wipes as directed on instruction sheet.  Do not wear lotions, powders, or perfumes or deodorant on the day of surgery.  Do not shave body hair from the neck down 48 hours before surgery.  Wear clean comfortable clothing (specific to your surgery type) to the hospital.  Do not wear jewelry, make-up, hairpins, clips or nail polish.  For welded (permanent) jewelry: bracelets, anklets, waist bands, etc.  Please have this removed prior to surgery.  If it is not removed, there is a chance that hospital personnel will need to cut it off on the day of surgery. Contact lenses, hearing aids and dentures may not be worn into surgery.  Do not bring valuables to the hospital. [facility] is not responsible for any missing/lost belongings or valuables.   Total Shoulder Arthroplasty:  use Benzoyl Peroxide 5% Gel as directed on instruction sheet.  Notify your doctor if there is any change in  your medical condition (cold, fever, infection).  If you are being discharged the day of surgery, you will not be allowed to drive home. You will need a responsible individual to drive you home and stay with you for 24 hours after surgery.   If you are taking public transportation, you will need to have a responsible individual with you.  If you are being admitted to the hospital overnight, leave your suitcase in the  car. After surgery it may be brought to your room.  In case of increased patient census, it may be necessary for you, the patient, to continue your postoperative care in the Same Day Surgery department.  After surgery, you can help prevent lung complications by doing breathing exercises.  Take deep breaths and cough every 1-2 hours. Your doctor may order a device called an Incentive Spirometer to help you take deep breaths. When coughing or sneezing, hold a pillow firmly against your incision with both hands. This is called "splinting." Doing this helps protect your incision. It also decreases belly discomfort.  Surgery Visitation Policy:  Patients undergoing a surgery or procedure may have two family members or support persons with them as long as the person is not COVID-19 positive or experiencing its symptoms.   Inpatient Visitation:    Visiting hours are 7 a.m. to 8 p.m. Up to four visitors are allowed at one time in a patient room. The visitors may rotate out with other people during the day. One designated support person (adult) may remain overnight.  Due to an increase in RSV and influenza rates and associated hospitalizations, children ages 15 and under will not be able to visit patients in [facility]. Masks continue to be strongly recommended.  Please call the Pre-admissions Testing Dept. at [phone] if you have any questions about these instructions.    Pre-operative 5 CHG Bath Instructions   You can play a key role in reducing the risk of infection after surgery. Your skin needs to be as free of germs as possible. You can reduce the number of germs on your skin by washing with CHG (chlorhexidine gluconate) soap before surgery. CHG is an antiseptic soap that kills germs and continues to kill germs even after washing.   DO NOT use if you have an allergy to chlorhexidine/CHG or antibacterial soaps. If your skin becomes reddened or irritated, stop using the CHG and  notify one of our RNs at [phone].   Please shower with the CHG soap starting 4 days before surgery using the following schedule:   Sunday 11/06/23 - Thursday 11/10/23    Please keep in mind the following:  DO NOT shave, including legs and underarms, starting the day of your first shower.   You may shave your face at any point before/day of surgery.  Place clean sheets on your bed the day you start using CHG soap. Use a clean washcloth (not used since being washed) for each shower. DO NOT sleep with pets once you start using the CHG.   CHG Shower Instructions:  If you choose to wash your hair and private area, wash first with your normal shampoo/soap.  After you use shampoo/soap, rinse your hair and body thoroughly to remove shampoo/soap residue.  Turn the water OFF and apply about 3 tablespoons (45 ml) of CHG soap to a CLEAN washcloth.  Apply CHG soap ONLY FROM YOUR NECK DOWN TO YOUR TOES (washing for 3-5 minutes)  DO NOT use CHG soap on face, private areas, open  wounds, or sores.  Pay special attention to the area where your surgery is being performed.  If you are having back surgery, having someone wash your back for you may be helpful. Wait 2 minutes after CHG soap is applied, then you may rinse off the CHG soap.  Pat dry with a clean towel  Put on clean clothes/pajamas   If you choose to wear lotion, please use ONLY the CHG-compatible lotions on the back of this paper.     Additional instructions for the day of surgery: DO NOT APPLY any lotions, deodorants, cologne, or perfumes.   Put on clean/comfortable clothes.  Brush your teeth.  Ask your nurse before applying any prescription medications to the skin.      CHG Compatible Lotions   Aveeno Moisturizing lotion  Cetaphil Moisturizing Cream  Cetaphil Moisturizing Lotion  Clairol Herbal Essence Moisturizing Lotion, Dry Skin  Clairol Herbal Essence Moisturizing Lotion, Extra Dry Skin  Clairol Herbal Essence  Moisturizing Lotion, Normal Skin  Curel Age Defying Therapeutic Moisturizing Lotion with Alpha Hydroxy  Curel Extreme Care Body Lotion  Curel Soothing Hands Moisturizing Hand Lotion  Curel Therapeutic Moisturizing Cream, Fragrance-Free  Curel Therapeutic Moisturizing Lotion, Fragrance-Free  Curel Therapeutic Moisturizing Lotion, Original Formula  Eucerin Daily Replenishing Lotion  Eucerin Dry Skin Therapy Plus Alpha Hydroxy Crme  Eucerin Dry Skin Therapy Plus Alpha Hydroxy Lotion  Eucerin Original Crme  Eucerin Original Lotion  Eucerin Plus Crme Eucerin Plus Lotion  Eucerin TriLipid Replenishing Lotion  Keri Anti-Bacterial Hand Lotion  Keri Deep Conditioning Original Lotion Dry Skin Formula Softly Scented  Keri Deep Conditioning Original Lotion, Fragrance Free Sensitive Skin Formula  Keri Lotion Fast Absorbing Fragrance Free Sensitive Skin Formula  Keri Lotion Fast Absorbing Softly Scented Dry Skin Formula  Keri Original Lotion  Keri Skin Renewal Lotion Keri Silky Smooth Lotion  Keri Silky Smooth Sensitive Skin Lotion  Nivea Body Creamy Conditioning Oil  Nivea Body Extra Enriched Lotion  Nivea Body Original Lotion  Nivea Body Sheer Moisturizing Lotion Nivea Crme  Nivea Skin Firming Lotion  NutraDerm 30 Skin Lotion  NutraDerm Skin Lotion  NutraDerm Therapeutic Skin Cream  NutraDerm Therapeutic Skin Lotion  ProShield Protective Hand Cream  Provon moisturizing lotion  Preparing for Total Shoulder Arthroplasty  Before surgery, you can play an important role by reducing the number of germs on your skin by using the following products:  Benzoyl Peroxide Gel  o Reduces the number of germs present on the skin  o Applied twice a day to shoulder area starting two days before surgery  Chlorhexidine Gluconate (CHG) Soap  o An antiseptic cleaner that kills germs and bonds with the skin to continue killing germs even after washing  o Used for showering the night before surgery  and morning of surgery  BENZOYL PEROXIDE 5% GEL  Please do not use if you have an allergy to benzoyl peroxide. If your skin becomes reddened/irritated stop using the benzoyl peroxide.  Starting two days before surgery, apply as follows:  1. Apply benzoyl peroxide in the morning and at night. Apply after taking a shower. If you are not taking a shower, clean entire shoulder front, back, and side along with the armpit with a clean wet washcloth.  2. Place a quarter-sized dollop on your shoulder and rub in thoroughly, making sure to cover the front, back, and side of your shoulder, along with the armpit.  2 days before ____ AM ____ PM (11/08/23 Tue) 1 day before ____ AM ____ PM (11/09/23  Wed)  3. Do this twice a day for two days. (Last application is the night before surgery, AFTER using the CHG soap).  4. Do NOT apply benzoyl peroxide gel on the day of surgery.

## 2023-11-09 NOTE — Anesthesia Preprocedure Evaluation (Signed)
Anesthesia Evaluation  Patient identified by MRN, date of birth, ID band Patient awake    Reviewed: Allergy & Precautions, NPO status , Patient's Chart, lab work & pertinent test results  History of Anesthesia Complications Negative for: history of anesthetic complications  Airway Mallampati: III   Neck ROM: Full    Dental  (+)    Pulmonary neg pulmonary ROS   Pulmonary exam normal breath sounds clear to auscultation       Cardiovascular Exercise Tolerance: Good Normal cardiovascular exam Rhythm:Regular Rate:Normal  ECG 07/27/21: NSR   Neuro/Psych  PSYCHIATRIC DISORDERS Anxiety     Vertigo; acoustic neuroma    GI/Hepatic negative GI ROS,,,  Endo/Other  diabetes, Well Controlled, Type 2  Prediabetes; obesity  Renal/GU negative Renal ROS     Musculoskeletal  (+) Arthritis ,    Abdominal  (+) + obese  Peds  Hematology negative hematology ROS (+)   Anesthesia Other Findings   Reproductive/Obstetrics                             Anesthesia Physical Anesthesia Plan  ASA: 2  Anesthesia Plan: General   Post-op Pain Management: Regional block*   Induction: Intravenous  PONV Risk Score and Plan: 2 and Ondansetron, Dexamethasone and Treatment may vary due to age or medical condition  Airway Management Planned: Oral ETT  Additional Equipment:   Intra-op Plan:   Post-operative Plan: Extubation in OR  Informed Consent:      Dental advisory given  Plan Discussed with: CRNA  Anesthesia Plan Comments:         Anesthesia Quick Evaluation

## 2023-11-10 ENCOUNTER — Other Ambulatory Visit: Payer: Self-pay

## 2023-11-10 ENCOUNTER — Encounter
Admission: RE | Disposition: A | Discharge status: Home / Self Care | Payer: Self-pay | Source: Ambulatory Visit | Attending: Surgery

## 2023-11-10 ENCOUNTER — Ambulatory Visit: Payer: 59

## 2023-11-10 ENCOUNTER — Encounter: Payer: Self-pay | Admitting: Surgery

## 2023-11-10 ENCOUNTER — Ambulatory Visit: Payer: 59 | Admitting: Urgent Care

## 2023-11-10 ENCOUNTER — Ambulatory Visit: Payer: Self-pay | Admitting: Urgent Care

## 2023-11-10 ENCOUNTER — Ambulatory Visit
Admission: RE | Admit: 2023-11-10 | Discharge: 2023-11-10 | Disposition: A | Discharge status: Home / Self Care | Payer: 59 | Source: Ambulatory Visit | Attending: Surgery | Admitting: Surgery

## 2023-11-10 DIAGNOSIS — S46011A Strain of muscle(s) and tendon(s) of the rotator cuff of right shoulder, initial encounter: Secondary | ICD-10-CM | POA: Diagnosis present

## 2023-11-10 DIAGNOSIS — E118 Type 2 diabetes mellitus with unspecified complications: Secondary | ICD-10-CM

## 2023-11-10 DIAGNOSIS — E669 Obesity, unspecified: Secondary | ICD-10-CM | POA: Insufficient documentation

## 2023-11-10 DIAGNOSIS — Z6837 Body mass index (BMI) 37.0-37.9, adult: Secondary | ICD-10-CM | POA: Diagnosis not present

## 2023-11-10 DIAGNOSIS — E119 Type 2 diabetes mellitus without complications: Secondary | ICD-10-CM | POA: Insufficient documentation

## 2023-11-10 DIAGNOSIS — W19XXXA Unspecified fall, initial encounter: Secondary | ICD-10-CM | POA: Diagnosis not present

## 2023-11-10 DIAGNOSIS — Z7984 Long term (current) use of oral hypoglycemic drugs: Secondary | ICD-10-CM | POA: Insufficient documentation

## 2023-11-10 HISTORY — PX: REVERSE SHOULDER ARTHROPLASTY: SHX5054

## 2023-11-10 LAB — GLUCOSE, CAPILLARY
Glucose-Capillary: 135 mg/dL — ABNORMAL HIGH (ref 70–99)
Glucose-Capillary: 194 mg/dL — ABNORMAL HIGH (ref 70–99)

## 2023-11-10 SURGERY — ARTHROPLASTY, SHOULDER, TOTAL, REVERSE
Anesthesia: Regional | Site: Shoulder | Laterality: Right

## 2023-11-10 MED ORDER — FENTANYL CITRATE PF 50 MCG/ML IJ SOSY
PREFILLED_SYRINGE | INTRAMUSCULAR | Status: AC
  Filled 2023-11-10: qty 1

## 2023-11-10 MED ORDER — CEFAZOLIN SODIUM-DEXTROSE 2-4 GM/100ML-% IV SOLN
2.0000 g | INTRAVENOUS | Status: AC
  Administered 2023-11-10: 2 g via INTRAVENOUS
  Administered 2023-11-10: 1 g via INTRAVENOUS

## 2023-11-10 MED ORDER — DROPERIDOL 2.5 MG/ML IJ SOLN
INTRAMUSCULAR | Status: AC
  Filled 2023-11-10: qty 2

## 2023-11-10 MED ORDER — OXYCODONE HCL 5 MG PO TABS
5.0000 mg | ORAL_TABLET | ORAL | Status: DC | PRN
Start: 2023-11-10 — End: 2023-11-10

## 2023-11-10 MED ORDER — ACETAMINOPHEN 10 MG/ML IV SOLN: 1000.0000 mg | Freq: Once | INTRAVENOUS | Status: DC | PRN

## 2023-11-10 MED ORDER — ACETAMINOPHEN 10 MG/ML IV SOLN
INTRAVENOUS | Status: DC | PRN
  Administered 2023-11-10: 1000 mg via INTRAVENOUS

## 2023-11-10 MED ORDER — MIDAZOLAM HCL 2 MG/2ML IJ SOLN
INTRAMUSCULAR | Status: AC
  Filled 2023-11-10: qty 2

## 2023-11-10 MED ORDER — ROCURONIUM BROMIDE 100 MG/10ML IV SOLN
INTRAVENOUS | Status: DC | PRN
  Administered 2023-11-10: 50 mg via INTRAVENOUS
  Administered 2023-11-10 (×2): 20 mg via INTRAVENOUS

## 2023-11-10 MED ORDER — TRANEXAMIC ACID-NACL 1000-0.7 MG/100ML-% IV SOLN
INTRAVENOUS | Status: AC
  Filled 2023-11-10: qty 100

## 2023-11-10 MED ORDER — ONDANSETRON HCL 4 MG/2ML IJ SOLN: 4.0000 mg | Freq: Four times a day (QID) | INTRAMUSCULAR | Status: DC | PRN

## 2023-11-10 MED ORDER — ACETAMINOPHEN 325 MG PO TABS: 325.0000 mg | ORAL_TABLET | Freq: Four times a day (QID) | ORAL | Status: DC | PRN

## 2023-11-10 MED ORDER — FENTANYL CITRATE (PF) 100 MCG/2ML IJ SOLN: 25.0000 ug | INTRAMUSCULAR | Status: DC | PRN

## 2023-11-10 MED ORDER — KETOROLAC TROMETHAMINE 30 MG/ML IJ SOLN
INTRAMUSCULAR | Status: AC
  Filled 2023-11-10: qty 1

## 2023-11-10 MED ORDER — BUPIVACAINE HCL (PF) 0.5 % IJ SOLN
INTRAMUSCULAR | Status: AC
  Filled 2023-11-10: qty 10

## 2023-11-10 MED ORDER — KETOCONAZOLE 2 % EX CREA: 1.0000 | TOPICAL_CREAM | Freq: Every day | CUTANEOUS | Status: AC | PRN

## 2023-11-10 MED ORDER — MUPIROCIN 2 % EX OINT: 1.0000 | TOPICAL_OINTMENT | Freq: Two times a day (BID) | CUTANEOUS | 0 refills | Status: AC

## 2023-11-10 MED ORDER — LIDOCAINE HCL (PF) 2 % IJ SOLN
INTRAMUSCULAR | Status: AC
  Filled 2023-11-10: qty 5

## 2023-11-10 MED ORDER — ONDANSETRON HCL 4 MG PO TABS: 4.0000 mg | ORAL_TABLET | Freq: Four times a day (QID) | ORAL | Status: DC | PRN

## 2023-11-10 MED ORDER — OXYCODONE HCL 5 MG PO TABS: 5.0000 mg | ORAL_TABLET | Freq: Once | ORAL | Status: DC | PRN

## 2023-11-10 MED ORDER — ORAL CARE MOUTH RINSE
15.0000 mL | Freq: Once | OROMUCOSAL | Status: AC
Start: 2023-11-10 — End: 2023-11-10

## 2023-11-10 MED ORDER — BUPIVACAINE LIPOSOME 1.3 % IJ SUSP
INTRAMUSCULAR | Status: AC
  Filled 2023-11-10: qty 20

## 2023-11-10 MED ORDER — MIDAZOLAM HCL 2 MG/2ML IJ SOLN
1.0000 mg | INTRAMUSCULAR | Status: DC | PRN
  Administered 2023-11-10: 2 mg via INTRAVENOUS

## 2023-11-10 MED ORDER — ROCURONIUM BROMIDE 10 MG/ML (PF) SYRINGE
PREFILLED_SYRINGE | INTRAVENOUS | Status: AC
  Filled 2023-11-10: qty 10

## 2023-11-10 MED ORDER — CEFAZOLIN SODIUM-DEXTROSE 3-4 GM/150ML-% IV SOLN
3.0000 g | Freq: Four times a day (QID) | INTRAVENOUS | Status: DC
  Filled 2023-11-10: qty 150

## 2023-11-10 MED ORDER — SODIUM CHLORIDE 0.9 % IR SOLN
Status: DC | PRN
  Administered 2023-11-10: 3000 mL

## 2023-11-10 MED ORDER — BUPIVACAINE HCL (PF) 0.5 % IJ SOLN
INTRAMUSCULAR | Status: DC | PRN
  Administered 2023-11-10: 10 mL via PERINEURAL

## 2023-11-10 MED ORDER — DEXAMETHASONE SODIUM PHOSPHATE 10 MG/ML IJ SOLN
INTRAMUSCULAR | Status: DC | PRN
  Administered 2023-11-10: 10 mg via INTRAVENOUS

## 2023-11-10 MED ORDER — DROPERIDOL 2.5 MG/ML IJ SOLN
0.6250 mg | Freq: Once | INTRAMUSCULAR | Status: AC | PRN
  Administered 2023-11-10: 0.625 mg via INTRAVENOUS

## 2023-11-10 MED ORDER — FENTANYL CITRATE (PF) 100 MCG/2ML IJ SOLN
INTRAMUSCULAR | Status: DC | PRN
  Administered 2023-11-10 (×2): 50 ug via INTRAVENOUS

## 2023-11-10 MED ORDER — PROPOFOL 10 MG/ML IV BOLUS
INTRAVENOUS | Status: AC
  Filled 2023-11-10: qty 20

## 2023-11-10 MED ORDER — SODIUM CHLORIDE 0.9 % IV SOLN: INTRAVENOUS | Status: DC

## 2023-11-10 MED ORDER — ONDANSETRON HCL 4 MG/2ML IJ SOLN
INTRAMUSCULAR | Status: DC | PRN
  Administered 2023-11-10: 4 mg via INTRAVENOUS

## 2023-11-10 MED ORDER — FENTANYL CITRATE PF 50 MCG/ML IJ SOSY
50.0000 ug | PREFILLED_SYRINGE | INTRAMUSCULAR | Status: DC | PRN
  Administered 2023-11-10: 50 ug via INTRAVENOUS

## 2023-11-10 MED ORDER — CEFAZOLIN SODIUM-DEXTROSE 2-4 GM/100ML-% IV SOLN
INTRAVENOUS | Status: AC
  Filled 2023-11-10: qty 100

## 2023-11-10 MED ORDER — OXYCODONE HCL 5 MG/5ML PO SOLN: 5.0000 mg | Freq: Once | ORAL | Status: DC | PRN

## 2023-11-10 MED ORDER — PROPOFOL 10 MG/ML IV BOLUS
INTRAVENOUS | Status: DC | PRN
  Administered 2023-11-10: 20 mg via INTRAVENOUS
  Administered 2023-11-10: 180 mg via INTRAVENOUS

## 2023-11-10 MED ORDER — BUPIVACAINE LIPOSOME 1.3 % IJ SUSP: 20.0000 mL | Freq: Once | INTRAMUSCULAR | Status: DC

## 2023-11-10 MED ORDER — ACETAMINOPHEN 10 MG/ML IV SOLN
INTRAVENOUS | Status: AC
  Filled 2023-11-10: qty 100

## 2023-11-10 MED ORDER — BUPIVACAINE-EPINEPHRINE (PF) 0.5% -1:200000 IJ SOLN
INTRAMUSCULAR | Status: DC | PRN
  Administered 2023-11-10: 30 mL

## 2023-11-10 MED ORDER — BUPIVACAINE LIPOSOME 1.3 % IJ SUSP
INTRAMUSCULAR | Status: DC | PRN
  Administered 2023-11-10: 20 mL via PERINEURAL

## 2023-11-10 MED ORDER — DEXAMETHASONE SODIUM PHOSPHATE 10 MG/ML IJ SOLN
INTRAMUSCULAR | Status: AC
  Filled 2023-11-10: qty 1

## 2023-11-10 MED ORDER — METOCLOPRAMIDE HCL 10 MG PO TABS: 5.0000 mg | ORAL_TABLET | Freq: Three times a day (TID) | ORAL | Status: DC | PRN

## 2023-11-10 MED ORDER — FENTANYL CITRATE (PF) 100 MCG/2ML IJ SOLN
INTRAMUSCULAR | Status: AC
  Filled 2023-11-10: qty 2

## 2023-11-10 MED ORDER — CHLORHEXIDINE GLUCONATE 0.12 % MT SOLN
OROMUCOSAL | Status: AC
  Filled 2023-11-10: qty 15

## 2023-11-10 MED ORDER — BUPIVACAINE-EPINEPHRINE (PF) 0.5% -1:200000 IJ SOLN
INTRAMUSCULAR | Status: AC
  Filled 2023-11-10: qty 30

## 2023-11-10 MED ORDER — BUPIVACAINE HCL 0.5 % IJ SOLN
10.0000 mL | INTRAMUSCULAR | Status: DC
  Filled 2023-11-10: qty 10

## 2023-11-10 MED ORDER — CHLORHEXIDINE GLUCONATE 4 % EX SOLN: 1.0000 | CUTANEOUS | 1 refills | Status: DC

## 2023-11-10 MED ORDER — ONDANSETRON HCL 4 MG/2ML IJ SOLN
INTRAMUSCULAR | Status: AC
  Filled 2023-11-10: qty 2

## 2023-11-10 MED ORDER — TRANEXAMIC ACID-NACL 1000-0.7 MG/100ML-% IV SOLN
1000.0000 mg | INTRAVENOUS | Status: AC
  Administered 2023-11-10: 1000 mg via INTRAVENOUS

## 2023-11-10 MED ORDER — OXYCODONE HCL 5 MG PO TABS: 5.0000 mg | ORAL_TABLET | ORAL | 0 refills | Status: DC | PRN

## 2023-11-10 MED ORDER — LIDOCAINE HCL (CARDIAC) PF 100 MG/5ML IV SOSY
PREFILLED_SYRINGE | INTRAVENOUS | Status: DC | PRN
  Administered 2023-11-10: 20 mg via INTRAVENOUS

## 2023-11-10 MED ORDER — METOCLOPRAMIDE HCL 5 MG/ML IJ SOLN: 5.0000 mg | Freq: Three times a day (TID) | INTRAMUSCULAR | Status: DC | PRN

## 2023-11-10 MED ORDER — KETOROLAC TROMETHAMINE 30 MG/ML IJ SOLN
30.0000 mg | Freq: Once | INTRAMUSCULAR | Status: AC
  Administered 2023-11-10: 30 mg via INTRAVENOUS

## 2023-11-10 MED ORDER — PHENYLEPHRINE HCL-NACL 20-0.9 MG/250ML-% IV SOLN
INTRAVENOUS | Status: DC | PRN
  Administered 2023-11-10: 40 ug/min via INTRAVENOUS

## 2023-11-10 MED ORDER — PHENYLEPHRINE HCL-NACL 20-0.9 MG/250ML-% IV SOLN
INTRAVENOUS | Status: AC
  Filled 2023-11-10: qty 250

## 2023-11-10 MED ORDER — SUGAMMADEX SODIUM 200 MG/2ML IV SOLN
INTRAVENOUS | Status: DC | PRN
  Administered 2023-11-10: 200 mg via INTRAVENOUS

## 2023-11-10 MED ORDER — CEFAZOLIN SODIUM 1 G IJ SOLR
INTRAMUSCULAR | Status: AC
  Filled 2023-11-10: qty 10

## 2023-11-10 MED ORDER — CHLORHEXIDINE GLUCONATE 0.12 % MT SOLN
15.0000 mL | Freq: Once | OROMUCOSAL | Status: AC
  Administered 2023-11-10: 15 mL via OROMUCOSAL

## 2023-11-10 SURGICAL SUPPLY — 62 items
BIT DRILL FLUTED 3.0 STRL (BIT) IMPLANT
BLADE SAW SAG 25X90X1.19 (BLADE) ×1 IMPLANT
CHLORAPREP W/TINT 26 (MISCELLANEOUS) ×1 IMPLANT
COOLER POLAR GLACIER W/PUMP (MISCELLANEOUS) ×1 IMPLANT
CUP SUT UNIV REVERS 39 NEU (Shoulder) IMPLANT
DRAPE INCISE IOBAN 66X45 STRL (DRAPES) ×1 IMPLANT
DRAPE SHEET LG 3/4 BI-LAMINATE (DRAPES) ×1 IMPLANT
DRAPE TABLE BACK 80X90 (DRAPES) ×1 IMPLANT
DRSG OPSITE POSTOP 4X8 (GAUZE/BANDAGES/DRESSINGS) ×1 IMPLANT
ELECT BLADE 6.5 EXT (BLADE) IMPLANT
ELECT CAUTERY BLADE 6.4 (BLADE) ×1 IMPLANT
ELECT REM PT RETURN 9FT ADLT (ELECTROSURGICAL) ×1
ELECTRODE REM PT RTRN 9FT ADLT (ELECTROSURGICAL) ×1 IMPLANT
GAUZE XEROFORM 1X8 LF (GAUZE/BANDAGES/DRESSINGS) ×1 IMPLANT
GLENOID UNI REV MOD 24 +2 LAT (Joint) IMPLANT
GLENOSPHERE 39+4 LAT/24 UNI RV (Joint) IMPLANT
GLOVE BIO SURGEON STRL SZ7.5 (GLOVE) ×4 IMPLANT
GLOVE BIO SURGEON STRL SZ8 (GLOVE) ×4 IMPLANT
GLOVE BIOGEL PI IND STRL 8 (GLOVE) ×2 IMPLANT
GLOVE INDICATOR 8.0 STRL GRN (GLOVE) ×1 IMPLANT
GOWN STRL REUS W/ TWL LRG LVL3 (GOWN DISPOSABLE) ×2 IMPLANT
GOWN STRL REUS W/ TWL XL LVL3 (GOWN DISPOSABLE) ×1 IMPLANT
HANDLE YANKAUER SUCT OPEN TIP (MISCELLANEOUS) ×1 IMPLANT
HOOD PEEL AWAY T7 (MISCELLANEOUS) ×3 IMPLANT
INSERT HUMERAL 39/+6 (Insert) IMPLANT
IV NS IRRIG 3000ML ARTHROMATIC (IV SOLUTION) ×1 IMPLANT
KIT STABILIZATION SHOULDER (MISCELLANEOUS) ×1 IMPLANT
KIT TURNOVER KIT A (KITS) ×1 IMPLANT
MANIFOLD NEPTUNE II (INSTRUMENTS) ×1 IMPLANT
MASK FACE SPIDER DISP (MASK) ×1 IMPLANT
MAT ABSORB FLUID 56X50 GRAY (MISCELLANEOUS) ×1 IMPLANT
NDL MAYO CATGUT SZ1 (NEEDLE) IMPLANT
NDL SAFETY ECLIPSE 18X1.5 (NEEDLE) ×1 IMPLANT
NDL SPNL 20GX3.5 QUINCKE YW (NEEDLE) ×1 IMPLANT
NEEDLE MAYO CATGUT SZ1 (NEEDLE)
NEEDLE SPNL 20GX3.5 QUINCKE YW (NEEDLE) ×1
PACK ARTHROSCOPY SHOULDER (MISCELLANEOUS) ×1 IMPLANT
PAD ARMBOARD 7.5X6 YLW CONV (MISCELLANEOUS) ×1 IMPLANT
PAD WRAPON POLAR SHDR UNIV (MISCELLANEOUS) ×1 IMPLANT
PIN NITINOL TARGETER 2.8 (PIN) IMPLANT
PULSAVAC PLUS IRRIG FAN TIP (DISPOSABLE) ×1
SCREW CENTRAL MOD 30MM (Screw) IMPLANT
SCREW PERI LOCK 5.5X16 (Screw) IMPLANT
SCREW PERI LOCK 5.5X36 (Screw) IMPLANT
SCREW PERIPHERAL 5.5X20 LOCK (Screw) IMPLANT
SLING ULTRA II LG (MISCELLANEOUS) IMPLANT
SLING ULTRA II M (MISCELLANEOUS) IMPLANT
SPONGE T-LAP 18X18 ~~LOC~~+RFID (SPONGE) ×2 IMPLANT
STAPLER SKIN PROX 35W (STAPLE) ×1 IMPLANT
STEM HUM UNIV REV SHLD 13 (Stem) IMPLANT
SUT ETHIBOND 0 MO6 C/R (SUTURE) ×1 IMPLANT
SUT FIBERWIRE #2 38 BLUE 1/2 (SUTURE) ×7
SUT VIC AB 0 CT1 36 (SUTURE) ×1 IMPLANT
SUT VIC AB 2-0 CT1 TAPERPNT 27 (SUTURE) ×2 IMPLANT
SUTURE FIBERWR #2 38 BLUE 1/2 (SUTURE) ×4 IMPLANT
SYR 10ML LL (SYRINGE) ×1 IMPLANT
SYR 30ML LL (SYRINGE) ×1 IMPLANT
SYR TOOMEY 50ML (SYRINGE) ×1 IMPLANT
TIP FAN IRRIG PULSAVAC PLUS (DISPOSABLE) ×1 IMPLANT
TRAP FLUID SMOKE EVACUATOR (MISCELLANEOUS) ×1 IMPLANT
WATER STERILE IRR 500ML POUR (IV SOLUTION) ×1 IMPLANT
WRAPON POLAR PAD SHDR UNIV (MISCELLANEOUS) ×1

## 2023-11-10 NOTE — Evaluation (Addendum)
Occupational Therapy Evaluation Patient Details Name: Jonathan Herring. MRN: [id number] DOB: 11-26-1965 Today's Date: 11/10/2023   History of Present Illness Jonathan Herring is a 58 y.o. 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 Prior to hospital admission, pt was living at home with his mother and reports IND with all mobility, ADLs and IADLs without use of AD. He has 14 steps with one HR on the R side with ascending.   Pt presents to acute OT demonstrating impaired ADL performance and functional mobility 2/2 R TSA. He is IND with STS and ambulated with no AD with SUP, as well as ascended/descend 3 steps with HR use going up sidways d/t inability to use RUE. No LOB during session. Mother present for session and will be assisting him upon DC home. He reported 3/10 pain in his RUE mostly to shoulder and thumb-edu on removing thumb strap while seated/not ambulating to relieve pressure/prevent irritation. Pt and his mother were instructed in polar care mgt, compression stockings mgt, sling/immobilizer mgt, ROM exercises for RUE (with instructions for no shoulder exercises until full sensation has returned), RUE precautions, adaptive strategies for bathing/dressing/toileting/grooming, positioning and considerations for sleep, and home/routines modifications to maximize falls prevention, safety, and independence. Handout provided. All education complete, will sign off. Pt to  begin [facility] this weekend.         If plan is discharge home, recommend the following: A little help with walking and/or transfers;A little help with bathing/dressing/bathroom;Assistance with cooking/housework;Assist for transportation    Functional Status Assessment  Patient has not had a recent decline in their functional status  Equipment Recommendations       Recommendations for Other Services       Precautions /  Restrictions Precautions Precautions: Shoulder Shoulder Interventions: Shoulder sling/immobilizer;Shoulder abduction pillow;Off for dressing/bathing/exercises Restrictions Weight Bearing Restrictions Per Provider Order: Yes RUE Weight Bearing Per Provider Order: Non weight bearing      Mobility Bed Mobility               General bed mobility comments: NT up in recliner pre/post session    Transfers Overall transfer level: Independent                 General transfer comment: IND with STS and SUP for all mobility around the post op unit with ability to go up/down 3 steps with HR use with SUP      Balance Overall balance assessment: Needs assistance Sitting-balance support: Feet supported, No upper extremity supported Sitting balance-Leahy Scale: Good       Standing balance-Leahy Scale: Good Standing balance comment: no LOB during mobility and stair training                           ADL either performed or assessed with clinical judgement   ADL Overall ADL's : Needs assistance/impaired                 Upper Body Dressing : Standing;Moderate assistance Upper Body Dressing Details (indicate cue type and reason): to don button up shirt                         Vision         Perception         Praxis  Pertinent Vitals/Pain Pain Assessment Pain Assessment: 0-10 Pain Score: 3  Pain Location: R shoulder/thumb Pain Intervention(s): Monitored during session     Extremity/Trunk Assessment Upper Extremity Assessment Upper Extremity Assessment: Generalized weakness;RUE deficits/detail RUE Deficits / Details: R TSA-shoulder precautions   Lower Extremity Assessment Lower Extremity Assessment: Overall WFL for tasks assessed       Communication Communication Communication: No apparent difficulties   Cognition Arousal: Alert Behavior During Therapy: WFL for tasks assessed/performed Overall Cognitive Status: Within  Functional Limits for tasks assessed                                 General Comments: a little groggy     General Comments       Exercises Other Exercises Other Exercises: Edu on shoulder precautions, use of polar care, sling wear, and adaptations for dressing/bathing and other ADLs upon DC home. Other Exercises: Edu on AROM to wrist and digits and PROM of R elbow.   Shoulder Instructions      Home Living Family/patient expects to be discharged to:: Private residence Living Arrangements: Parent Available Help at Discharge: Family Type of Home: House Home Access: Stairs to enter [employer] of Steps: 4 in front or 14 from garage Entrance Stairs-Rails: Right Home Layout: One level                          Prior Functioning/Environment Prior Level of Function : Independent/Modified Independent                        OT Problem List: Decreased strength;Decreased range of motion;Pain;Impaired UE functional use      OT Treatment/Interventions:      OT Goals(Current goals can be found in the care plan section)    OT Frequency:      Co-evaluation              AM-PAC OT "6 Clicks" Daily Activity     Outcome Measure Help from another person eating meals?: None Help from another person taking care of personal grooming?: None Help from another person toileting, which includes using toliet, bedpan, or urinal?: A Little Help from another person bathing (including washing, rinsing, drying)?: A Little Help from another person to put on and taking off regular upper body clothing?: A Little Help from another person to put on and taking off regular lower body clothing?: A Little 6 Click Score: 20   End of Session Equipment Utilized During Treatment:  (shoulder sling for TSA) Nurse Communication: Mobility status  Activity Tolerance: Patient tolerated treatment well Patient left: in chair;with call bell/phone within reach;with  family/visitor present;with nursing/sitter in room  OT Visit Diagnosis: Muscle weakness (generalized) (M62.81);Pain Pain - Right/Left: Right Pain - part of body: Shoulder                Time: 2951-8841 OT Time Calculation (min): 37 min Charges:  OT Evaluation $OT Eval Moderate Complexity: 1 Mod OT Treatments $Self Care/Home Management : 8-22 mins Jonathan Herring, OTR/L 11/10/23, 2:04 PM Yaw Escoto E Mollie Rossano 11/10/2023, 2:00 PM

## 2023-11-10 NOTE — Progress Notes (Signed)
Realized patient did not receive his post-op antibiotic.  Reported to Dr Joice Lofts at 1430 while he was in the OR with another case.  Dr. Joice Lofts said not to worry about it. Jonathan Herring, Jonathan Herring should be fine.

## 2023-11-10 NOTE — Discharge Instructions (Addendum)
Orthopedic discharge instructions: May shower with intact OpSite dressing once nerve block has worn off (around Monday morning).  Apply ice frequently to shoulder or use Polar Care device. Take ibuprofen 800 mg TID with meals for 7-10 days, then as necessary. Take oxycodone as prescribed when needed.  May supplement with ES Tylenol if necessary. Keep shoulder immobilizer on at all times except may remove for bathing purposes and for exercises with therapy. Follow-up in 10-14 days or as scheduled.   Interscalene Nerve Block (ISNB) Discharge Instructions    For your surgery you have received an Interscalene Nerve Block. Nerve Blocks affect many types of nerves, including nerves that control movement, pain and normal sensation.  You may experience feelings such as numbness, tingling, heaviness, weakness or the inability to move your arm or the feeling or sensation that your arm has "fallen asleep". A nerve block can last for 2 - 36 hours or more depending on the medication used.  Usually the weakness wears off first.  The tingling and heaviness usually wear off next.  Finally you may start to notice pain.  Keep in mind that this may occur in any order.  once a nerve block starts to wear off it is usually completely gone within 60 minutes. ISNB may cause mild shortness of breath, a hoarse voice, blurry vision, unequal pupils, or drooping of the face on the same side as the nerve block.  These symptoms will usually go away within 12 hours.  Very rarely the procedure itself can cause mild seizures. If needed, your surgeon will give you a prescription for pain medication.  It will take about 60 minutes for the oral pain medication to become fully effective.  So, it is recommended that you start taking this medication before the nerve block first begins to wear off, or when you first begin to feel discomfort. Keep in mind that nerve blocks often wear off in the middle of the night.   If you are going to bed  and the block has not started to wear off or you have not started to have any discomfort, consider setting an alarm for 2 to 3 hours, so you can assess your block.  If you notice the block is wearing off or you are starting to have discomfort, you can take your pain medication. Take your pain medication only as prescribed.  Pain medication can cause sedation and decrease your breathing if you take more than you need for the level of pain that you have. Nausea is a common side effect of many pain medications.  You may want to eat something before taking your pain medicine to prevent nausea. After an Interscalene nerve block, you cannot feel pain, pressure or extremes in temperature in the effected arm.  Because your arm is numb it is at an increased risk for injury.  To decrease the possibility of injury, please practice the following:  While you are awake change the position of your arm frequently to prevent too much pressure on any one area for prolonged periods of time.  If you have a cast or tight dressing, check the color or your fingers every couple of hours.  Call your surgeon with the appearance of any discoloration (white or blue). If you are given a sling to wear before you go home, please wear it  at all times until the block has completely worn off.  Do not get up at night without your sling. If you experience any problems or concerns, please  contact your surgeon's office. If you experience severe or prolonged shortness of breath go to the nearest emergency department. POLAR CARE INFORMATION  MassAdvertisement.it  How to use [employer] Polar Care [facility] Therapy System?  YouTube   ShippingScam.co.uk  OPERATING INSTRUCTIONS  Start the product With dry hands, connect the transformer to the electrical connection located on the top of the cooler. Next, plug the transformer into an appropriate electrical outlet. The unit will automatically start running at this point.  To  stop the pump, disconnect electrical power.  Unplug to stop the product when not in use. Unplugging the Polar Care unit turns it off. Always unplug immediately after use. Never leave it plugged in while unattended. Remove pad.    FIRST ADD WATER TO FILL LINE, THEN ICE---Replace ice when existing ice is almost melted  1 Discuss Treatment with your Licensed Health Care Practitioner and Use Only as Prescribed 2 Apply Insulation Barrier & Cold Therapy Pad 3 Check for Moisture 4 Inspect Skin Regularly  Tips and Trouble Shooting Usage Tips 1. Use cubed or chunked ice for optimal performance. 2. It is recommended to drain the Pad between uses. To drain the pad, hold the Pad upright with the hose pointed toward the ground. Depress the black plunger and allow water to drain out. 3. You may disconnect the Pad from the unit without removing the pad from the affected area by depressing the silver tabs on the hose coupling and gently pulling the hoses apart. The Pad and unit will seal itself and will not leak. Note: Some dripping during release is normal. 4. DO NOT RUN PUMP WITHOUT WATER! The pump in this unit is designed to run with water. Running the unit without water will cause permanent damage to the pump. 5. Unplug unit before removing lid.  TROUBLESHOOTING GUIDE Pump not running, Water not flowing to the pad, Pad is not getting cold 1. Make sure the transformer is plugged into the wall outlet. 2. Confirm that the ice and water are filled to the indicated levels. 3. Make sure there are no kinks in the pad. 4. Gently pull on the blue tube to make sure the tube/pad junction is straight. 5. Remove the pad from the treatment site and ll it while the pad is lying at; then reapply. 6. Confirm that the pad couplings are securely attached to the unit. Listen for the double clicks (Figure 1) to confirm the pad couplings are securely attached.  Leaks    Note: Some condensation on the lines, controller, and  pads is unavoidable, especially in warmer climates. 1. If using a [employer] Polar Care Cold Therapy unit with a detachable Cold Therapy Pad, and a leak exists (other than condensation on the lines) disconnect the pad couplings. Make sure the silver tabs on the couplings are depressed before reconnecting the pad to the pump hose; then confirm both sides of the coupling are properly clicked in. 2. If the coupling continues to leak or a leak is detected in the pad itself, stop using it and call [employer] Customer Care at [phone].  Cleaning After use, empty and dry the unit with a soft cloth. Warm water and mild detergent may be used occasionally to clean the pump and tubes.  WARNING: The Polar Care Cube can be cold enough to cause serious injury, including full skin necrosis. Follow these Operating Instructions, and carefully read the Product Insert (see pouch on side of unit) and the Cold Therapy Pad Fitting Instructions (provided with each  Cold Therapy Pad) prior to use.       SHOULDER SLING IMMOBILIZER   VIDEO Slingshot 2 Shoulder Brace Application - YouTube ---https://www.porter.info/  INSTRUCTIONS While supporting the injured arm, slide the forearm into the sling. Wrap the adjustable shoulder strap around the neck and shoulders and attach the strap end to the sling using  the "alligator strap tab."  Adjust the shoulder strap to the required length. Position the shoulder pad behind the neck. To secure the shoulder pad location (optional), pull the shoulder strap away from the shoulder pad, unfold the hook material on the top of the pad, then press the shoulder strap back onto the hook material to secure the pad in place. Attach the closure strap across the open top of the sling. Position the strap so that it holds the arm securely in the sling. Next, attach the thumb strap to the open end of the sling between the thumb and fingers. After sling has been fit, it may be easily  removed and reapplied using the quick release buckle on shoulder strap. If a neutral pillow or 15 abduction pillow is included, place the pillow at the waistline. Attach the sling to the pillow, lining up hook material on the pillow with the loop on sling. Adjust the waist strap to fit.  If waist strap is too long, cut it to fit. Use the small piece of double sided hook material (located on top of the pillow) to secure the strap end. Place the double sided hook material on the inside of the cut strap end and secure it to the waist strap.     If no pillow is included, attach the waist strap to the sling and adjust to fit.    Washing Instructions: Straps and sling must be removed and cleaned regularly depending on your activity level and perspiration. Hand wash straps and sling in cold water with mild detergent, rinse, air dry

## 2023-11-10 NOTE — Anesthesia Procedure Notes (Signed)
Procedure Name: Intubation Date/Time: 11/10/2023 7:45 AM  Performed by: Lily Lovings, CRNAPre-anesthesia Checklist: Patient identified, Patient being monitored, Timeout performed, Emergency Drugs available and Suction available Patient Re-evaluated:Patient Re-evaluated prior to induction Oxygen Delivery Method: Circle system utilized Preoxygenation: Pre-oxygenation with 100% oxygen Induction Type: IV induction Ventilation: Mask ventilation without difficulty Laryngoscope Size: 3, Glidescope and McGrath Grade View: Grade I Tube type: Oral Tube size: 7.5 mm Number of attempts: 1 Airway Equipment and Method: Stylet Placement Confirmation: ETT inserted through vocal cords under direct vision, positive ETCO2 and breath sounds checked- equal and bilateral Secured at: 23 cm Tube secured with: Tape Dental Injury: Teeth and Oropharynx as per pre-operative assessment  Comments: Soft bite block

## 2023-11-10 NOTE — Progress Notes (Signed)
   11/10/23 0645  Spiritual Encounters  Type of Visit Initial  Care provided to: Patient  Conversation partners present during encounter Nurse  Referral source Chaplain assessment  Reason for visit Routine spiritual support  Interventions  Spiritual Care Interventions Made Established relationship of care and support;Compassionate presence  Intervention Outcomes  Outcomes Connection to spiritual care  Spiritual Care Plan  Spiritual Care Issues Still Outstanding No further spiritual care needs at this time (see row info)

## 2023-11-10 NOTE — Transfer of Care (Signed)
Immediate Anesthesia Transfer of Care Note  Patient: Jonathan Herring.  Procedure(s) Performed: REVERSE SHOULDER ARTHROPLASTY (Right: Shoulder)  Patient Location: PACU  Anesthesia Type:General and Regional  Level of Consciousness: drowsy and patient cooperative  Airway & Oxygen Therapy: Patient Spontanous Breathing and Patient connected to face mask oxygen  Post-op Assessment: Report given to RN and Patient moving all extremities  Post vital signs: Reviewed and stable  Last Vitals:  Vitals Value Taken Time  BP 125/63 11/10/23 1028  Temp 36.6 C 11/10/23 1028  Pulse 76 11/10/23 1028  Resp 17 11/10/23 1028  SpO2 92 % 11/10/23 1028  Vitals shown include unfiled device data.  Last Pain:  Vitals:   11/10/23 1028  TempSrc: Tympanic  PainSc: 0-No pain         Complications: No notable events documented.

## 2023-11-10 NOTE — Anesthesia Postprocedure Evaluation (Signed)
Anesthesia Post Note  Patient: Jonathan Herring.  Procedure(s) Performed: REVERSE SHOULDER ARTHROPLASTY WITH BICEPS TENODESIS (Right: Shoulder)  Patient location during evaluation: PACU Anesthesia Type: Regional Level of consciousness: awake and alert Pain management: pain level controlled Vital Signs Assessment: post-procedure vital signs reviewed and stable Respiratory status: spontaneous breathing, nonlabored ventilation and respiratory function stable Cardiovascular status: blood pressure returned to baseline and stable Postop Assessment: no apparent nausea or vomiting Anesthetic complications: no   No notable events documented.   Last Vitals:  Vitals:   11/10/23 1115 11/10/23 1130  BP: 135/68 120/60  Pulse: 78 74  Resp: 12 16  Temp: (!) 36.3 C 36.5 C  SpO2: 93% 93%    Last Pain:  Vitals:   11/10/23 1130  TempSrc: Temporal  PainSc: 0-No pain                 Foye Deer

## 2023-11-10 NOTE — H&P (Signed)
History of Present Illness: Jonathan Herring is a 58 y.o. who presents today for history and physical. He is to undergo a reverse right total shoulder arthroplasty on 11/10/2023. Since his last visit at the clinic there is been no improvement in his condition. Patient expresses his desire to proceed with surgery.  The patient's symptoms for started about 30 years ago as a result of a skiing accident when he fell onto his outstretched right arm and sustained a fracture dislocation of his shoulder which was treated nonsurgically. Over the years, his symptoms have gradually worsened. He saw Dr. Rosita Kea in September who sent him for an MRI scan, then referred him to me for further evaluation and treatment. The patient describes the symptoms as moderate (patient is active but has had to make modifications or give up activities) and have the quality of being aching, miserable, nagging, stabbing, tender, and throbbing. The pain is localized to the lateral arm/shoulder and localized to the anterior shoulder. These symptoms are aggravated with normal daily activities, with sleeping, carrying heavy objects, at higher levels of activity, with overhead activity, and reaching behind the back. He has tried no medications for this problem. He has tried rest with limited benefit. He has not tried any physical therapy or steroid injections for the symptoms. He is right-hand dominant. He denies any neck pain, and denies any numbness or paresthesias down his arm to his hand.   Past Medical History: Allergy 01/26/2023  Pollen  Anxiety 04/2012  Diabetes mellitus type 2, uncomplicated (CMS/HHS-HCC) 05/2017  Hypertension 04/2016  Obesity  Shingles  Sleep apnea ongoing but not related to my stress   Past Surgical History HERNIA REPAIR 2000-01 (Umbilical)   Past Family History: No Known Problems Father  No Known Problems Mother   Medications: cyanocobalamin (VITAMIN B12) 1000 MCG tablet Take 1,000 mcg by mouth once daily   metFORMIN (GLUCOPHAGE) 500 MG tablet Take 1 tablet (500 mg total) by mouth 2 (two) times daily 60 tablet 5  triamcinolone 0.5 % ointment Apply 1 Application topically as needed   Allergies: No Known Allergies   Review of Systems A comprehensive 14 point ROS was performed, reviewed, and the pertinent orthopaedic findings are documented in the HPI.  Physical Exam: BP (!) 140/80 (BP Location: Left upper arm, Patient Position: Sitting, BP Cuff Size: Large Adult)  Ht 180.3 cm (5\' 11" )  Wt (!) 124.4 kg (274 lb 3.2 oz)  BMI 38.24 kg/m   General: Well-developed well-nourished male seen in no acute distress.   HEENT: Atraumatic,normocephalic. Pupils are equal and reactive to light. Oropharynx is clear with moist mucosa  Lungs: Clear to auscultation bilaterally   Cardiovascular: Regular rate and rhythm. Normal S1, S2. No murmurs. No appreciable gallops or rubs. Peripheral pulses are palpable.  Abdomen: Soft, non-tender, nondistended. Bowel sounds present  Right shoulder exam: SKIN: normal SWELLING: none WARMTH: none LYMPH NODES: no adenopathy palpable CREPITUS: none TENDERNESS: Mildly tender over anterolateral shoulder ROM (active):  Forward flexion: 135 degrees Abduction: 120 degrees Internal rotation: Right PSIS ROM (passive):  Forward flexion: 150 degrees Abduction: 135 degrees ER/IR at 90 abd: 85 degrees / 55 degrees  He experiences moderate pain with forward flexion and abduction, and mild pain at the extremes of all other motions.  STRENGTH: Forward flexion: 3+/5 Abduction: 3+/5 External rotation: 4/5 Internal rotation: 4-4+/5 Pain with RC testing: Moderate pain with resisted forward flexion and abduction more so than with resisted external rotation  STABILITY: Normal  SPECIAL TESTS: Juanetta Gosling' test: positive,  moderate Speed's test: positive Capsulitis - pain w/ passive ER: no Crossed arm test: Mildly positive Crank: Not evaluated Anterior apprehension:  Negative Posterior apprehension: Not evaluated  Neurological: The patient is alert and oriented Sensation to light touch appears to be intact and within normal limits Gross motor strength appeared to be equal to 5/5  Vascular : Peripheral pulses felt to be palpable. Capillary refill appears to be intact and within normal limits  Imaging: X-rays taken in [facility] demonstrate mild degenerative changes as manifest by mild narrowing of the superior portion of the glenohumeral joint space. The subacromial space is markedly decreased. There is no subacromial or infra-clavicular spurring. He demonstrates a Type II acromion.  Right Shoulder Imaging, MRI: MRI Shoulder Cartilage: "Mild-moderate thinning of the glenohumeral cartilage." MRI Shoulder Rotator Cuff: Substantial tearing of the entire supraspinatus and infraspinatus tendons with moderate atrophy of both muscles. There also is evidence of partial-thickness tearing of the superior portion of the subscapularis tendon. Retracted to the glenohumeral joint. MRI Shoulder Labrum / Biceps: Biceps tendinopathy. MRI Shoulder Bone: Normal bone.  Impression: 1. Traumatic complete tear right rotator cuff.  Plan:  The treatment options were discussed with the patient. In addition, patient educational materials were provided regarding the diagnosis and treatment options. The patient is quite frustrated by his symptoms and function limitations, and is ready to consider more aggressive treatment options. Based on his MRI findings and the size of his rotator cuff tears, I do not feel that he is a good candidate for an attempted rotator cuff repair. Therefore, I have recommended a surgical procedure, specifically a reverse right total shoulder arthroplasty. The procedure was discussed with the patient, as were the potential risks (including bleeding, infection, nerve and/or blood vessel injury, persistent or recurrent pain, loosening and/or failure of  the components, dislocation, need for further surgery, blood clots, strokes, heart attacks and/or arhythmias, pneumonia, etc.) and benefits. The patient states his understanding and wishes to proceed. All of the patient's questions and concerns were answered. He can call any time with further concerns. He will follow up post-surgery, routine.    H&P reviewed and patient re-examined. No changes.

## 2023-11-10 NOTE — Anesthesia Procedure Notes (Signed)
Anesthesia Regional Block: Interscalene brachial plexus block   Pre-Anesthetic Checklist: , timeout performed,  Correct Patient, Correct Site, Correct Laterality,  Correct Procedure, Correct Position, site marked,  Risks and benefits discussed,  Surgical consent,  Pre-op evaluation,  At surgeon's request and post-op pain management  Laterality: Upper and Right  Prep: chloraprep       Needles:  Injection technique: Single-shot  Needle Type: Stimiplex     Needle Length: 9cm  Needle Gauge: 22     Additional Needles:   Procedures:,,,, ultrasound used (permanent image in chart),,    Narrative:  Start time: 11/10/2023 7:17 AM End time: 11/10/2023 7:21 AM Injection made incrementally with aspirations every 5 mL.  Performed by: Personally  Anesthesiologist: Foye Deer, MD  Additional Notes: Patient consented for risk and benefits of nerve block including but not limited to nerve damage, failed block, bleeding and infection.  Patient voiced understanding.  Functioning IV was confirmed and monitors were applied.  Timeout done prior to procedure and prior to any sedation being given to the patient.  Patient confirmed procedure site prior to any sedation given to the patient. Sterile prep,hand hygiene and sterile gloves were used.  Minimal sedation used for procedure.  No paresthesia endorsed by patient during the procedure.  Negative aspiration and negative test dose prior to incremental administration of local anesthetic. The patient tolerated the procedure well with no immediate complications.

## 2023-11-10 NOTE — Op Note (Signed)
11/10/2023  10:01 AM  Patient:   Constance Holster.  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  Post-Op Diagnosis:   Same  Procedure:   Reverse right total shoulder arthroplasty with biceps tenodesis.  Surgeon:   Maryagnes Amos, MD  Assistant:   Horris Latino, PA-C; Juanetta Snow, PA-S  Anesthesia:   General endotracheal with an interscalene block using Exparel placed preoperatively by the anesthesiologist.  Findings:   As above.  Complications:   None  EBL:   50 cc  Fluids:   450 cc crystalloid  UOP:   None  TT:   None  Drains:   None  Closure:   Staples  Implants:   All press-fit Arthrex system with a #13 [employer] Revers short humeral stem, a 39 mm SutureCup with a +6 insert, and a 24 mm base plate with a 39 mm +4 lateralized glenosphere.  Brief Clinical Note:   The patient is a 58 year old male with a long history of progressively worsening right shoulder pain and weakness following a significant fracture dislocation of his shoulder resulting from a skiing accident 30 years ago. Over the years, his symptoms have worsened, prompting him to seek evaluation. His symptoms have progressed despite medications, activity modification, etc. Incision examination consistent with a massive irreparable rotator cuff tear and early cuff arthropathy, all of which were confirmed by MRI scan. The patient presents at this time for a reverse right total shoulder arthroplasty with biceps tenodesis.  Procedure:   The patient underwent placement of an interscalene block using Exparel by the anesthesiologist in the preoperative holding area before being brought into the operating room and lain in the supine position. The patient then underwent general endotracheal intubation and anesthesia before the patient was repositioned in the beach chair position using the beach chair positioner. The right shoulder and upper  extremity were prepped with ChloraPrep solution before being draped sterilely. Preoperative antibiotics were administered. A timeout was performed to verify the appropriate surgical site.    A standard anterior approach to the shoulder was made through an approximately 4-5 inch incision. The incision was carried down through the subcutaneous tissues to expose the deltopectoral fascia. The interval between the deltoid and pectoralis muscles was identified and this plane developed, retracting the cephalic vein laterally with the deltoid muscle. The conjoined tendon was identified. Its lateral margin was dissected and the Kolbel self-retraining retractor inserted. The "three sisters" were identified and cauterized. Bursal tissues were removed to improve visualization.   The biceps tendon was identified near the inferior aspect of the bicipital groove. A soft tissue tenodesis was performed by attaching the biceps tendon to the adjacent pectoralis major tendon using two #0 Ethibond interrupted sutures. The biceps tendon was then transected just proximal to the tenodesis site. The subscapularis tendon was released from its attachment to the lesser tuberosity 1 cm proximal to its insertion and several tagging sutures placed. The inferior capsule was released with care after identifying and protecting the axillary nerve. The proximal humeral cut was made at approximately 30 of retroversion using the extra-medullary guide.   Attention was redirected to the glenoid. The labrum was debrided circumferentially before the center of the glenoid was marked with electrocautery. The guidewire was drilled into the glenoid neck using the appropriate guide. After verifying its position, it was overreamed with the baseplate reamer to create a flat surface before the peripheral reamer was introduced to clean off  any peripheral soft tissues. The central 10 mm coring reamer was then inserted before the hole was tapped to complete the  glenoid preparation. The permanent mini-baseplate construct with a 30 mm screw attachment was screwed into place and tightened securely. The baseplate was then further secured using four peripheral locking screws. The permanent 39 mm +2 mm lateralized glenosphere was then impacted into place and its Morse taper locking mechanism verified using manual distraction. Finally, the glenosphere locking screw was inserted and tightened securely.  Attention was directed to the humeral side. The humeral canal was reamed with first the 5 mm and then the 6 mm reamer. The canal was broached beginning with a #5 broach and progressing to a #13 short broach. This was left in place and the metaphyseal inset reamer was used to create the metaphyseal socket.  A trial reduction performed using the 39 mm trial humeral platform with first the +3 mm insert and then the +6 mm insert. With the +6 mm insert, the arm demonstrated excellent range of motion as the hand could be brought across the chest to the opposite shoulder and brought to the top of the patient's head and to the patient's ear. The shoulder appeared stable throughout this range of motion. The joint was dislocated and the trial components removed. The permanent #13 [employer] Revers short stem was impacted into place with care taken to maintain the appropriate version. The permanent 39 mm SutureCup humeral platform was then impacted into place before the +6 mm insert was snapped into place. The shoulder was relocated using two finger pressure and again placed through a range of motion with the findings as described above.  The wound was copiously irrigated with sterile saline solution using the jet lavage system before a total of 30 cc of 0.5% Sensorcaine with epinephrine was injected into the pericapsular and peri-incisional tissues to help with postoperative analgesia. The subscapularis tendon was reapproximated using #2 FiberWire interrupted sutures. The deltopectoral  interval was closed using #0 Vicryl interrupted sutures before the subcutaneous tissues were closed using 2-0 Vicryl interrupted sutures. The skin was closed using staples. A sterile occlusive dressing was applied to the wound before the arm was placed into a shoulder immobilizer with an abduction pillow. A Polar Care system also was applied to the shoulder. The patient was then transferred back to a hospital bed before being awakened, extubated, and returned to the recovery room in satisfactory condition after tolerating the procedure well.

## 2023-11-11 ENCOUNTER — Encounter: Payer: Self-pay | Admitting: Surgery

## 2023-12-08 ENCOUNTER — Ambulatory Visit: Payer: 59 | Admitting: Dermatology

## 2023-12-20 ENCOUNTER — Ambulatory Visit: Payer: 59 | Admitting: Dermatology

## 2024-01-16 ENCOUNTER — Encounter: Payer: Self-pay | Admitting: Dermatology

## 2024-01-16 ENCOUNTER — Ambulatory Visit (INDEPENDENT_AMBULATORY_CARE_PROVIDER_SITE_OTHER): Admitting: Dermatology

## 2024-01-16 DIAGNOSIS — L821 Other seborrheic keratosis: Secondary | ICD-10-CM

## 2024-01-16 DIAGNOSIS — L570 Actinic keratosis: Secondary | ICD-10-CM | POA: Diagnosis not present

## 2024-01-16 DIAGNOSIS — Z79899 Other long term (current) drug therapy: Secondary | ICD-10-CM

## 2024-01-16 DIAGNOSIS — Z7189 Other specified counseling: Secondary | ICD-10-CM

## 2024-01-16 DIAGNOSIS — L578 Other skin changes due to chronic exposure to nonionizing radiation: Secondary | ICD-10-CM | POA: Diagnosis not present

## 2024-01-16 DIAGNOSIS — L820 Inflamed seborrheic keratosis: Secondary | ICD-10-CM | POA: Diagnosis not present

## 2024-01-16 DIAGNOSIS — Z5111 Encounter for antineoplastic chemotherapy: Secondary | ICD-10-CM | POA: Diagnosis not present

## 2024-01-16 DIAGNOSIS — W908XXA Exposure to other nonionizing radiation, initial encounter: Secondary | ICD-10-CM | POA: Diagnosis not present

## 2024-01-16 MED ORDER — FLUOROURACIL 5 % EX CREA: TOPICAL_CREAM | CUTANEOUS | 0 refills | Status: AC

## 2024-01-16 NOTE — Progress Notes (Signed)
 Follow-Up Visit   Subjective  Jonathan Gewirtz. is a 58 y.o. male who presents for the following:  4 month ak follow up, Reports some rough areas at top of scalp  The patient has spots, moles and lesions to be evaluated, some may be new or changing and the patient may have concern these could be cancer.  The following portions of the chart were reviewed this encounter and updated as appropriate: medications, allergies, medical history  Review of Systems:  No other skin or systemic complaints except as noted in HPI or Assessment and Plan.  Objective  Well appearing patient in no apparent distress; mood and affect are within normal limits.  A focused examination was performed of the following areas: Scalp, face, ears, hands, arms   Relevant exam findings are noted in the Assessment and Plan.  Scalp  >20 (20) Erythematous thin papules/macules with gritty scale.  scalp x 4 (4) Erythematous stuck-on, waxy papule or plaque  Assessment & Plan   SEBORRHEIC KERATOSIS - Stuck-on, waxy, tan-brown papules and/or plaques  - Benign-appearing - Discussed benign etiology and prognosis. - Observe - Call for any changes  ACTINIC KERATOSIS (20) Scalp  >20 (20) In 2 months start fluorouracil 5 % cream - apply thin coat to top of scalp twice daily for 14 days. Rx sent to [facility].   Patient advised to call or send mychart if rx is over $60 and will send to another pharmacy  ACTINIC DAMAGE WITH PRECANCEROUS ACTINIC KERATOSES Counseling for Topical Chemotherapy Management: Patient exhibits: - Severe, confluent actinic changes with pre-cancerous actinic keratoses that is secondary to cumulative UV radiation exposure over time - Condition that is severe; chronic, not at goal. - diffuse scaly erythematous macules and papules with underlying dyspigmentation - Discussed Prescription "Field Treatment" topical Chemotherapy for Severe, Chronic Confluent Actinic Changes with Pre-Cancerous  Actinic Keratoses Field treatment involves treatment of an entire area of skin that has confluent Actinic Changes (Sun/ Ultraviolet light damage) and PreCancerous Actinic Keratoses by method of PhotoDynamic Therapy (PDT) and/or prescription Topical Chemotherapy agents such as 5-fluorouracil, 5-fluorouracil/calcipotriene, and/or imiquimod.  The purpose is to decrease the number of clinically evident and subclinical PreCancerous lesions to prevent progression to development of skin cancer by chemically destroying early precancer changes that may or may not be visible.  It has been shown to reduce the risk of developing skin cancer in the treated area. As a result of treatment, redness, scaling, crusting, and open sores may occur during treatment course. One or more than one of these methods may be used and may have to be used several times to control, suppress and eliminate the PreCancerous changes. Discussed treatment course, expected reaction, and possible side effects. - Recommend daily broad spectrum sunscreen SPF 30+ to sun-exposed areas, reapply every 2 hours as needed.  - Staying in the shade or wearing long sleeves, sun glasses (UVA+UVB protection) and wide brim hats (4-inch brim around the entire circumference of the hat) are also recommended. - Call for new or changing lesions.   Actinic keratoses are precancerous spots that appear secondary to cumulative UV radiation exposure/sun exposure over time. They are chronic with expected duration over 1 year. A portion of actinic keratoses will progress to squamous cell carcinoma of the skin. It is not possible to reliably predict which spots will progress to skin cancer and so treatment is recommended to prevent development of skin cancer.  Recommend daily broad spectrum sunscreen SPF 30+ to sun-exposed areas, reapply every  2 hours as needed.  Recommend staying in the shade or wearing long sleeves, sun glasses (UVA+UVB protection) and wide brim hats  (4-inch brim around the entire circumference of the hat). Call for new or changing lesions. Destruction of lesion - Scalp  >20 (20) Complexity: simple   Destruction method: cryotherapy   Informed consent: discussed and consent obtained   Timeout:  patient name, date of birth, surgical site, and procedure verified Lesion destroyed using liquid nitrogen: Yes   Region frozen until ice ball extended beyond lesion: Yes   Outcome: patient tolerated procedure well with no complications   Post-procedure details: wound care instructions given   Related Medications fluorouracil (EFUDEX) 5 % cream Apply topically thin layer twice daily to top of scalp for actinic keratosis INFLAMED SEBORRHEIC KERATOSIS (4) scalp x 4 (4) Symptomatic, irritating, patient would like treated. Destruction of lesion - scalp x 4 (4) Complexity: simple   Destruction method: cryotherapy   Informed consent: discussed and consent obtained   Timeout:  patient name, date of birth, surgical site, and procedure verified Lesion destroyed using liquid nitrogen: Yes   Region frozen until ice ball extended beyond lesion: Yes   Outcome: patient tolerated procedure well with no complications   Post-procedure details: wound care instructions given    Return for 6 - 8 month ak follow up.  IAsher Muir, CMA, am acting as scribe for Armida Sans, MD.   Documentation: I have reviewed the above documentation for accuracy and completeness, and I agree with the above.  Armida Sans, MD

## 2024-01-16 NOTE — Patient Instructions (Addendum)
 In 2 month start 5 % cream once daily for 14 day to top of scalp  Sent to local pharmacy  If more than 60 dollars send mychart message    - Start 5-fluorouracil cream twice a day for 14 days to affected areas including top scalp.  Reviewed course of treatment and expected reaction.  Patient advised to expect inflammation and crusting and advised that erosions are possible.  Patient advised to be diligent with sun protection during and after treatment. Counseled to keep medication out of reach of children and pets.  5-Fluorouracil/Calcipotriene Patient Education   Actinic keratoses are the dry, red scaly spots on the skin caused by sun damage. A portion of these spots can turn into skin cancer with time, and treating them can help prevent development of skin cancer.   Treatment of these spots requires removal of the defective skin cells. There are various ways to remove actinic keratoses, including freezing with liquid nitrogen, treatment with creams, or treatment with a blue light procedure in the office.   5-fluorouracil cream is a topical cream used to treat actinic keratoses. It works by interfering with the growth of abnormal fast-growing skin cells, such as actinic keratoses. These cells peel off and are replaced by healthy ones.   5-fluorouracil/calcipotriene is a combination of the 5-fluorouracil cream with a vitamin D analog cream called calcipotriene. The calcipotriene alone does not treat actinic keratoses. However, when it is combined with 5-fluorouracil, it helps the 5-fluorouracil treat the actinic keratoses much faster so that the same results can be achieved with a much shorter treatment time.  INSTRUCTIONS FOR 5-FLUOROURACIL/CALCIPOTRIENE CREAM:   5-fluorouracil/calcipotriene cream typically only needs to be used for 4-7 days. A thin layer should be applied twice a day to the treatment areas recommended by your physician.   If your physician prescribed you separate tubes of  5-fluourouracil and calcipotriene, apply a thin layer of 5-fluorouracil followed by a thin layer of calcipotriene.   Avoid contact with your eyes, nostrils, and mouth. Do not use 5-fluorouracil/calcipotriene cream on infected or open wounds.   You will develop redness, irritation and some crusting at areas where you have pre-cancer damage/actinic keratoses. IF YOU DEVELOP PAIN, BLEEDING, OR SIGNIFICANT CRUSTING, STOP THE TREATMENT EARLY - you have already gotten a good response and the actinic keratoses should clear up well.  Wash your hands after applying 5-fluorouracil 5% cream on your skin.   A moisturizer or sunscreen with a minimum SPF 30 should be applied each morning.   Once you have finished the treatment, you can apply a thin layer of Vaseline twice a day to irritated areas to soothe and calm the areas more quickly. If you experience significant discomfort, contact your physician.  For some patients it is necessary to repeat the treatment for best results.  SIDE EFFECTS: When using 5-fluorouracil/calcipotriene cream, you may have mild irritation, such as redness, dryness, swelling, or a mild burning sensation. This usually resolves within 2 weeks. The more actinic keratoses you have, the more redness and inflammation you can expect during treatment. Eye irritation has been reported rarely. If this occurs, please let us know.  If you have any trouble using this cream, please call the office. If you have any other questions about this information, please do not hesitate to ask me before you leave the office.        Cryotherapy Aftercare  Wash gently with soap and water everyday.   Apply Vaseline and Band-Aid daily until healed.  Actinic keratoses are precancerous spots that appear secondary to cumulative UV radiation exposure/sun exposure over time. They are chronic with expected duration over 1 year. A portion of actinic keratoses will progress to squamous cell carcinoma of  the skin. It is not possible to reliably predict which spots will progress to skin cancer and so treatment is recommended to prevent development of skin cancer.  Recommend daily broad spectrum sunscreen SPF 30+ to sun-exposed areas, reapply every 2 hours as needed.  Recommend staying in the shade or wearing long sleeves, sun glasses (UVA+UVB protection) and wide brim hats (4-inch brim around the entire circumference of the hat). Call for new or changing lesions.    Seborrheic Keratosis  What causes seborrheic keratoses? Seborrheic keratoses are harmless, common skin growths that first appear during adult life.  As time goes by, more growths appear.  Some people may develop a large number of them.  Seborrheic keratoses appear on both covered and uncovered body parts.  They are not caused by sunlight.  The tendency to develop seborrheic keratoses can be inherited.  They vary in color from skin-colored to gray, brown, or even black.  They can be either smooth or have a rough, warty surface.   Seborrheic keratoses are superficial and look as if they were stuck on the skin.  Under the microscope this type of keratosis looks like layers upon layers of skin.  That is why at times the top layer may seem to fall off, but the rest of the growth remains and re-grows.    Treatment Seborrheic keratoses do not need to be treated, but can easily be removed in the office.  Seborrheic keratoses often cause symptoms when they rub on clothing or jewelry.  Lesions can be in the way of shaving.  If they become inflamed, they can cause itching, soreness, or burning.  Removal of a seborrheic keratosis can be accomplished by freezing, burning, or surgery. If any spot bleeds, scabs, or grows rapidly, please return to have it checked, as these can be an indication of a skin cancer.    Due to recent changes in healthcare laws, you may see results of your pathology and/or laboratory studies on MyChart before the doctors have  had a chance to review them. We understand that in some cases there may be results that are confusing or concerning to you. Please understand that not all results are received at the same time and often the doctors may need to interpret multiple results in order to provide you with the best plan of care or course of treatment. Therefore, we ask that you please give [place] 2 business days to thoroughly review all your results before contacting the office for clarification. Should we see a critical lab result, you will be contacted sooner.   If You Need Anything After Your Visit  If you have any questions or concerns for your doctor, please call our main line at [phone] and press option 4 to reach your doctor's medical assistant. If no one answers, please leave a voicemail as directed and we will return your call as soon as possible. Messages left after 4 pm will be answered the following business day.   You may also send [place] a message via MyChart. We typically respond to MyChart messages within 1-2 business days.  For prescription refills, please ask your pharmacy to contact our office. Our fax number is [phone].  If you have an urgent issue when the clinic is closed that cannot wait until  the next business day, you can page your doctor at the number below.    Please note that while we do our best to be available for urgent issues outside of office hours, we are not available 24/7.   If you have an urgent issue and are unable to reach [place], you may choose to seek medical care at your doctor's office, retail clinic, urgent care center, or emergency room.  If you have a medical emergency, please immediately call 911 or go to the emergency department.  Pager Numbers  - Dr. Gwen Pounds: [phone]  - Dr. Roseanne Reno: [phone]  - Dr. Katrinka Blazing: [phone]   In the event of inclement weather, please call our main line at [phone] for an update on the status of any delays or  closures.  Dermatology Medication Tips: Please keep the boxes that topical medications come in in order to help keep track of the instructions about where and how to use these. Pharmacies typically print the medication instructions only on the boxes and not directly on the medication tubes.   If your medication is too expensive, please contact our office at [phone] option 4 or send [place] a message through MyChart.   We are unable to tell what your co-pay for medications will be in advance as this is different depending on your insurance coverage. However, we may be able to find a substitute medication at lower cost or fill out paperwork to get insurance to cover a needed medication.   If a prior authorization is required to get your medication covered by your insurance company, please allow [place] 1-2 business days to complete this process.  Drug prices often vary depending on where the prescription is filled and some pharmacies may offer cheaper prices.  The website www.goodrx.com contains coupons for medications through different pharmacies. The prices here do not account for what the cost may be with help from insurance (it may be cheaper with your insurance), but the website can give you the price if you did not use any insurance.  - You can print the associated coupon and take it with your prescription to the pharmacy.  - You may also stop by our office during regular business hours and pick up a GoodRx coupon card.  - If you need your prescription sent electronically to a different pharmacy, notify our office through [facility] or by phone at [phone] option 4.     Si Usted Necesita Algo Despus de Su Visita  Tambin puede enviarnos un mensaje a travs de Clinical cytogeneticist. Por lo general respondemos a los mensajes de MyChart en el transcurso de 1 a 2 das hbiles.  Para renovar recetas, por favor pida a su farmacia que se ponga en contacto con nuestra oficina. Annie Sable de fax  es [place] [phone].  Si tiene un asunto urgente cuando la clnica est cerrada y que no puede esperar hasta el siguiente da hbil, puede llamar/localizar a su doctor(a) al nmero que aparece a continuacin.   Por favor, tenga en cuenta que aunque hacemos todo lo posible para estar disponibles para asuntos urgentes fuera del horario de [place], no estamos disponibles las 24 horas del da, los 7 [address]  [address] de la [place].   Si tiene un problema urgente y no puede comunicarse con nosotros, puede optar por buscar atencin mdica  en el consultorio de su doctor(a), en una clnica privada, en un centro de atencin urgente o en una sala de emergencias.  Si tiene [employer], por favor llame inmediatamente  al 911 o vaya a la sala de Sports administrator.  Nmeros de bper  - Dr. Gwen Pounds: [phone]  - Dra. Roseanne Reno: [phone]  - Dr. Katrinka Blazing: [phone]   En caso de inclemencias del tiempo, por favor llame a Lacy Duverney principal al [phone] para una actualizacin sobre el [place] de cualquier retraso o cierre.  Consejos para la medicacin en dermatologa: Por favor, guarde las cajas en las que vienen los medicamentos de uso tpico para ayudarle a seguir las instrucciones sobre dnde y cmo usarlos. Las farmacias generalmente imprimen las instrucciones del medicamento slo en las cajas y no directamente en los tubos del [place].   Si su medicamento es muy caro, por favor, pngase en contacto con Rolm Gala llamando al [phone] y presione la opcin 4 o envenos un mensaje a travs de Clinical cytogeneticist.   No podemos decirle cul ser su copago por los medicamentos por adelantado ya que esto es diferente dependiendo de la cobertura de su seguro. Sin embargo, es posible que podamos encontrar un medicamento sustituto a Audiological scientist un formulario para que el seguro cubra el medicamento que se considera necesario.   Si se requiere una autorizacin previa para que su compaa de seguros  [place] su medicamento, por favor permtanos de 1 a 2 das hbiles para completar [address]  Los precios de los medicamentos varan con frecuencia dependiendo del Environmental consultant de dnde se surte la receta y alguna farmacias pueden ofrecer precios ms baratos.  El sitio web www.goodrx.com tiene cupones para medicamentos de Health and safety inspector. Los precios aqu no tienen en cuenta lo que podra costar con la ayuda del seguro (puede ser ms barato con su seguro), pero el sitio web puede darle el precio si no utiliz Tourist information centre manager.  - Puede imprimir el cupn correspondiente y llevarlo con su receta a la farmacia.  - Tambin puede pasar por nuestra oficina durante el horario de atencin regular y Education officer, museum una tarjeta de cupones de GoodRx.  - Si necesita que su receta se enve electrnicamente a una farmacia diferente, informe a nuestra oficina a travs de MyChart de [place] o por telfono llamando al [phone] y presione la opcin 4.

## 2024-02-20 ENCOUNTER — Inpatient Hospital Stay
Admission: RE | Admit: 2024-02-20 | Discharge: 2024-02-20 | Disposition: A | Discharge status: Home / Self Care | Payer: Self-pay | Source: Ambulatory Visit | Attending: Neurosurgery | Admitting: Neurosurgery

## 2024-02-20 ENCOUNTER — Other Ambulatory Visit: Payer: Self-pay | Admitting: Family Medicine

## 2024-02-20 DIAGNOSIS — Z049 Encounter for examination and observation for unspecified reason: Secondary | ICD-10-CM

## 2024-02-21 ENCOUNTER — Ambulatory Visit: Admitting: Neurosurgery

## 2024-02-27 NOTE — Progress Notes (Unsigned)
 Referring Physician:  Brant Caldron, FNP [address] [place],  [place] [address]  Primary Physician:  [facility]  History of Present Illness: 02/28/2024 Mr. Filip Trimmer is here today with a chief complaint of back pain for the past 6 months or so.  His pain is on both sides of his lower back but worse on the left.  It is as bad as 7 out of 10.  Standing and walking make it worse.  He has improvement in his pain with laying down.  He denies numbness or tingling.  He tried physical therapy last year without improvement.  He has tried multiple injections without improvement.  Bowel/Bladder Dysfunction: none  Conservative measures:  Physical therapy: did in 2024 Multimodal medical therapy including regular antiinflammatories: Oxycodone , Gabapentin  Injections: 01/13/2024: Bilateral L4-5 transforaminal ESI (self pay) 12/12/2023: MBB to the bilateral L4-5 and L5-S1 facet joints (5/10 to 0/10)   Past Surgery: none  Lenard Quam. has no symptoms of cervical myelopathy.  The symptoms are causing a significant impact on the patient's life.   I have utilized the care everywhere function in epic to review the outside records available from external health systems.  Review of Systems:  A 10 point review of systems is negative, except for the pertinent positives and negatives detailed in the HPI.  Past Medical History: Past Medical History:  Diagnosis Date   Acoustic neuroma (HCC)    Diabetes mellitus without complication (HCC)    ED (erectile dysfunction)    Elevated blood pressure reading    no meds   Vertigo     Past Surgical History: Past Surgical History:  Procedure Laterality Date   CLOSED MANIPULATION SHOULDER Right    fracture and dislocation   COLONOSCOPY     HERNIA REPAIR  1990   umbilical   REVERSE SHOULDER ARTHROPLASTY Right 11/10/2023   Procedure: REVERSE SHOULDER ARTHROPLASTY WITH BICEPS TENODESIS;  Surgeon: Elner Hahn, MD;   Location: [facility] ORS;  Service: Orthopedics;  Laterality: Right;   XI ROBOTIC ASSISTED INGUINAL HERNIA REPAIR WITH MESH Bilateral 08/07/2021   Procedure: XI ROBOTIC ASSISTED BILATERAL INGUINAL HERNIA REPAIR WITH MESH;  Surgeon: Conrado Delay, DO;  Location: [facility] ORS;  Service: General;  Laterality: Bilateral;    Allergies: Allergies as of 02/28/2024   (No Known Allergies)    Medications:  Current Outpatient Medications:    Apoaequorin (PREVAGEN PO), Take 1 capsule by mouth daily., Disp: , Rfl:    Cyanocobalamin (B-12) 1000 MCG SUBL, Place 1,000 mcg under the tongue daily., Disp: , Rfl:    fluorouracil  (EFUDEX ) 5 % cream, Apply topically thin layer twice daily to top of scalp for actinic keratosis, Disp: 40 g, Rfl: 0   glipiZIDE (GLUCOTROL XL) 2.5 MG 24 hr tablet, Take 2.5 mg by mouth., Disp: , Rfl:    ketoconazole  (NIZORAL ) 2 % cream, Apply 1 Application topically daily as needed for irritation., Disp: , Rfl:    metFORMIN  (GLUCOPHAGE ) 500 MG tablet, Take 500 mg by mouth 2 (two) times daily., Disp: , Rfl:    NEOMYCIN-POLYMYXIN-HYDROCORTISONE (CORTISPORIN) 1 % SOLN OTIC solution, Place 3 drops into the left ear every 8 (eight) hours., Disp: , Rfl:    triamcinolone ointment (KENALOG) 0.5 %, Apply 1 Application topically 2 (two) times daily as needed (irritation)., Disp: , Rfl:   Social History: Social History   Tobacco Use   Smoking status: Never   Smokeless tobacco: Never  Vaping Use   Vaping status: Never Used  Substance Use Topics   Alcohol use: No   Drug use: No    Family Medical History: History reviewed. No pertinent family history.  Physical Examination: Vitals:   02/28/24 1404  BP: 132/80    General: Patient is in no apparent distress. Attention to examination is appropriate.  Neck:   Supple.  Full range of motion.  Respiratory: Patient is breathing without any difficulty.   NEUROLOGICAL:     Awake, alert, oriented to person, place, and time.  Speech is clear  and fluent.   Cranial Nerves: Pupils equal round and reactive to light.  Facial tone is symmetric.  Facial sensation is symmetric. Shoulder shrug is symmetric. Tongue protrusion is midline.  There is no pronator drift.  Strength: Side Biceps Triceps Deltoid Interossei Grip Wrist Ext. Wrist Flex.  R 5 5 5 5 5 5 5   L 5 5 5 5 5 5 5    Side Iliopsoas Quads Hamstring PF DF EHL  R 5 5 5 5 5 5   L 5 5 5 5 5 5    Reflexes are 1+ and symmetric at the biceps, triceps, brachioradialis, patella and achilles.   Hoffman's is absent.   Bilateral upper and lower extremity sensation is intact to light touch.    No evidence of dysmetria noted.  Gait is normal.     Medical Decision Making  Imaging: MRI L spine 10/01/2023 Disc levels:   L1-2: Mild facet hypertrophy is present bilaterally. No focal disc protrusion or stenosis is present.   L2-3: Moderate facet hypertrophy is present bilaterally. No focal disc protrusion or stenosis is present.   L3-4: Mild broad-based disc protrusion present. Moderate facet hypertrophy is present bilaterally. No focal stenosis is present.   L4-5: Moderate facet hypertrophy is worse left than right. Uncovering of a broad-based disc protrusion present. Left subarticular and foraminal narrowing is present. The foramina are patent.   L5-S1: Mild facet hypertrophy is present bilaterally. No focal disc protrusion stenosis is present.   IMPRESSION: 1. Moderate facet hypertrophy at L4-5 is worse left than right. Uncovering of a broad-based disc protrusion is present. Left subarticular and foraminal narrowing is present. 2. Mild broad-based disc protrusion at L3-4 without stenosis. 3. Mild facet hypertrophy at L1-2, L2-3, and L5-S1 without stenosis.     Electronically Signed   By: Audree Leas M.D.   On: 10/01/2023 14:39  I have personally reviewed the images and agree with the above interpretation.  Assessment and Plan: Mr. Leonetti is a pleasant 58  y.o. male with grade 1 spondylolisthesis of L4 and L5.  This is approximately 2 mm.  He has no significant compression of any of his nerve roots.  He has back pain without sciatica.  He has tried physical therapy and injections.  I have recommended weight loss and consideration of a spinal cord stimulator if his policy would allow it.  There is no role for surgical intervention.  I spent a total of 30 minutes in this patient's care today. This time was spent reviewing pertinent records including imaging studies, obtaining and confirming history, performing a directed evaluation, formulating and discussing my recommendations, and documenting the visit within the medical record.      Thank you for involving me in the care of this patient.      Luceal Hollibaugh K. Mont Antis MD, [facility] Neurosurgery

## 2024-02-28 ENCOUNTER — Encounter: Payer: Self-pay | Admitting: Neurosurgery

## 2024-02-28 ENCOUNTER — Ambulatory Visit (INDEPENDENT_AMBULATORY_CARE_PROVIDER_SITE_OTHER): Admitting: Neurosurgery

## 2024-02-28 VITALS — BP 132/80 | Ht 71.0 in | Wt 269.0 lb

## 2024-02-28 DIAGNOSIS — M545 Low back pain, unspecified: Secondary | ICD-10-CM

## 2024-02-28 DIAGNOSIS — M4316 Spondylolisthesis, lumbar region: Secondary | ICD-10-CM | POA: Diagnosis not present

## 2024-02-28 DIAGNOSIS — G8929 Other chronic pain: Secondary | ICD-10-CM

## 2024-06-27 ENCOUNTER — Ambulatory Visit: Payer: Self-pay | Admitting: Dermatology

## 2024-07-30 ENCOUNTER — Ambulatory Visit: Admitting: Dermatology
